# Patient Record
Sex: Male | Born: 1950 | Race: White | Hispanic: No | Marital: Married | Smoking: Former smoker
Health system: Southern US, Community
[De-identification: ages and names within clinical notes are randomized; demographics above are authoritative.]

## PROBLEM LIST (undated history)

## (undated) DIAGNOSIS — C220 Liver cell carcinoma: Secondary | ICD-10-CM

## (undated) DIAGNOSIS — B1920 Unspecified viral hepatitis C without hepatic coma: Secondary | ICD-10-CM

## (undated) DIAGNOSIS — Z923 Personal history of irradiation: Secondary | ICD-10-CM

## (undated) DIAGNOSIS — C7951 Secondary malignant neoplasm of bone: Secondary | ICD-10-CM

## (undated) HISTORY — DX: Personal history of irradiation: Z92.3

---

## 2007-07-13 ENCOUNTER — Ambulatory Visit: Payer: Self-pay | Admitting: Family Medicine

## 2007-09-10 ENCOUNTER — Ambulatory Visit: Payer: Self-pay | Admitting: Family Medicine

## 2009-03-31 HISTORY — PX: LIVER TRANSPLANT: SHX410

## 2009-06-13 IMAGING — CR DG CHEST 2V
1 series · 3 of 3 positions shown · non-contrast
Comparison: none

REASON FOR EXAM: cough
COMMENTS:

[Series 1: view not recorded · 0.17mm/px · 3 of 3 slices shown]
[im 1/3]
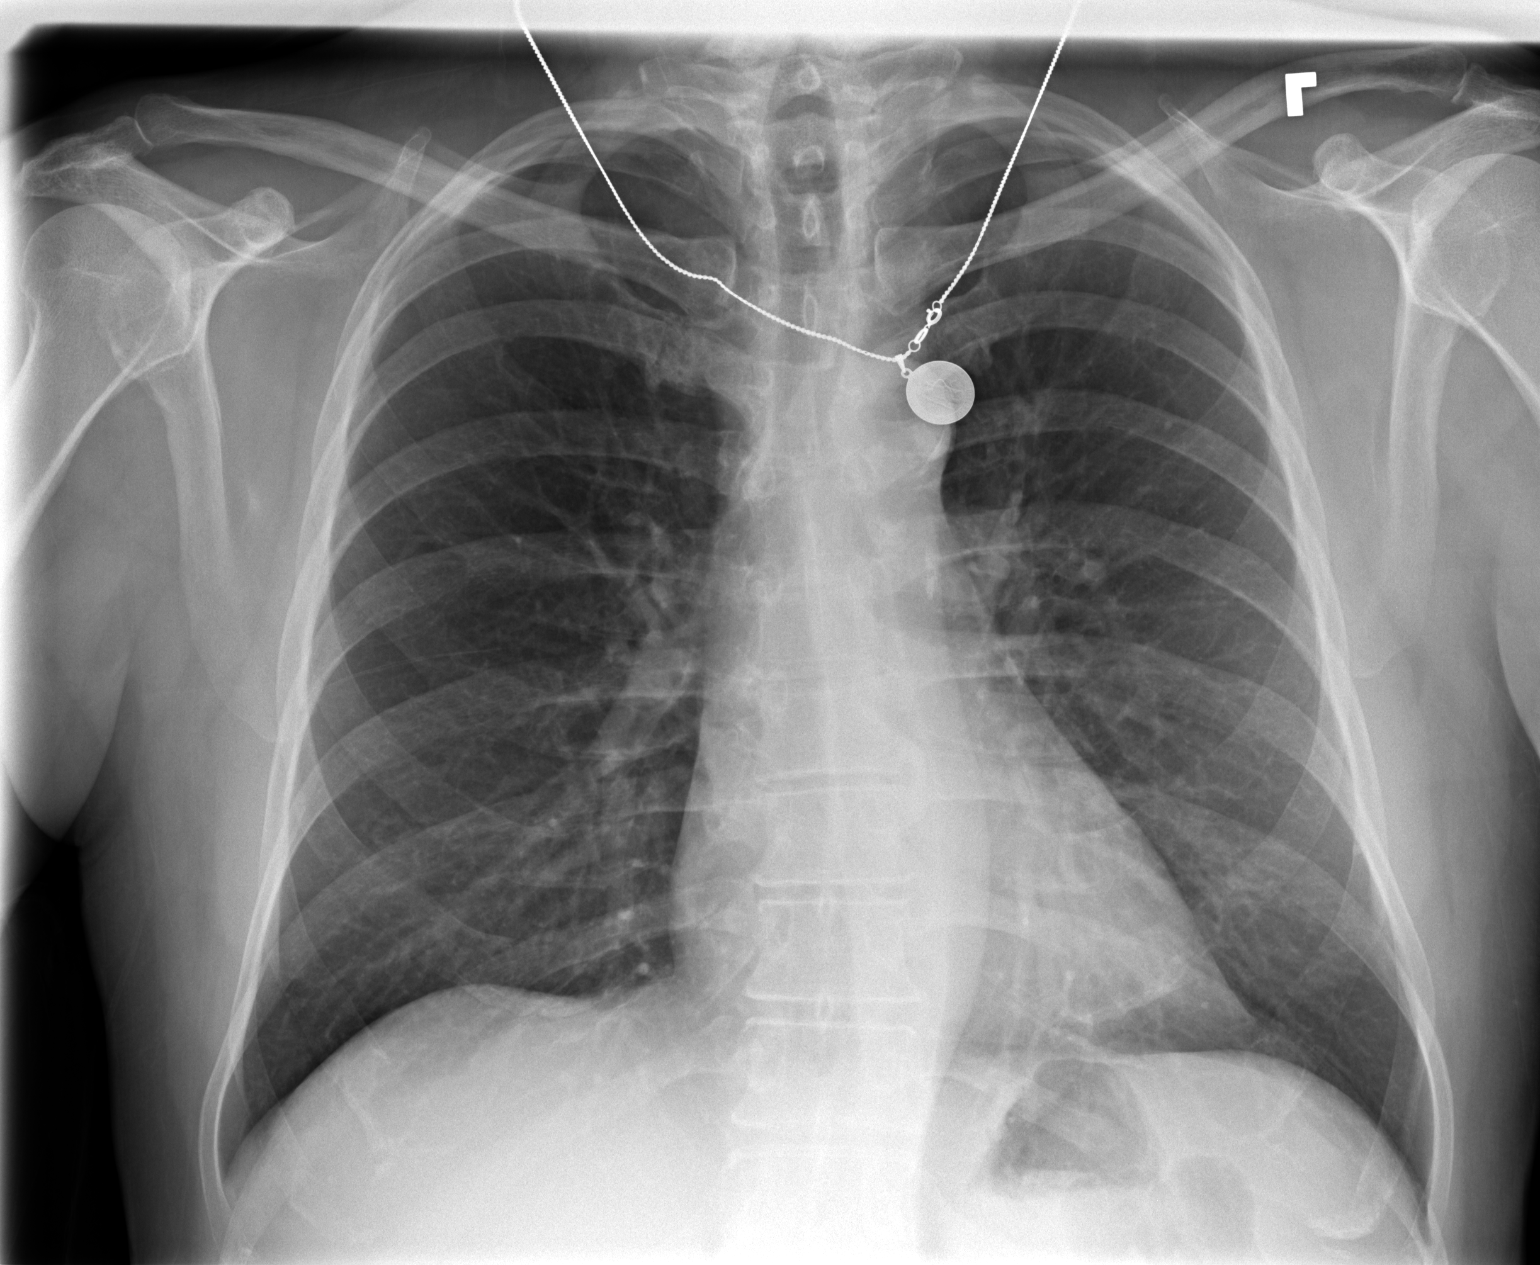
[im 2/3]
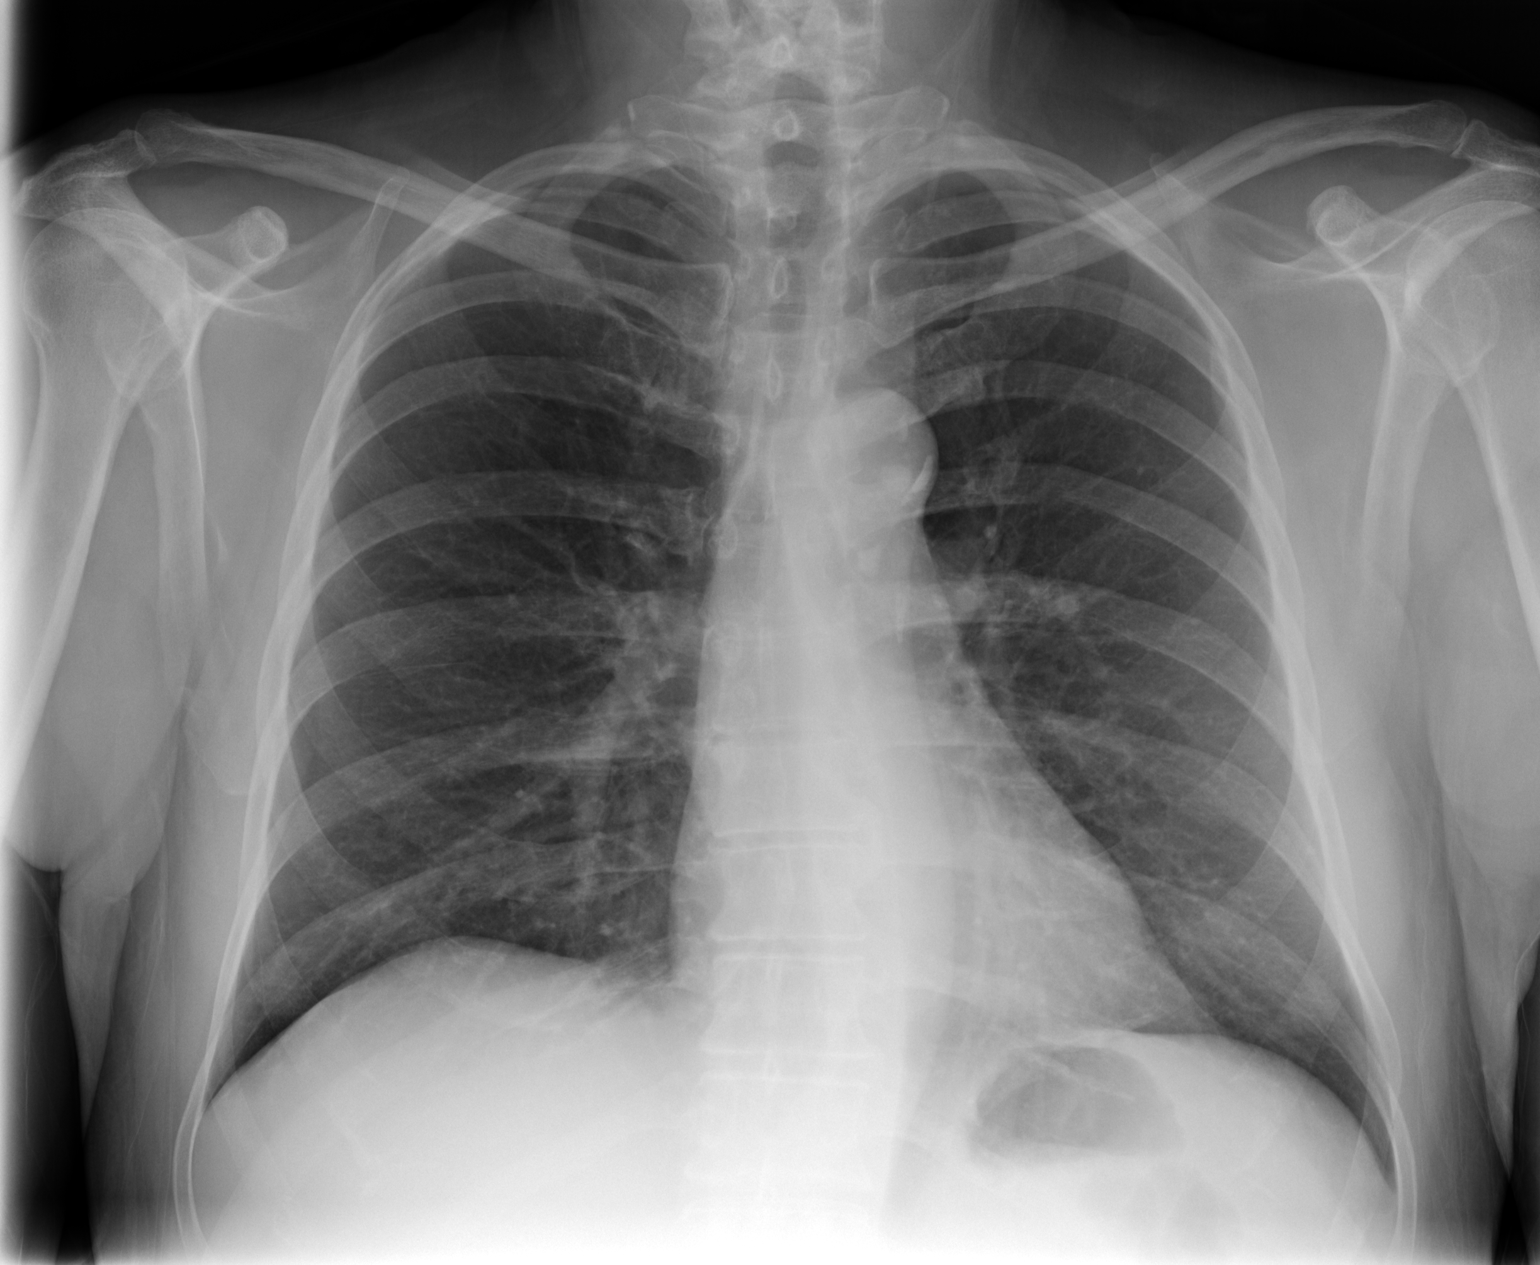
[im 3/3]
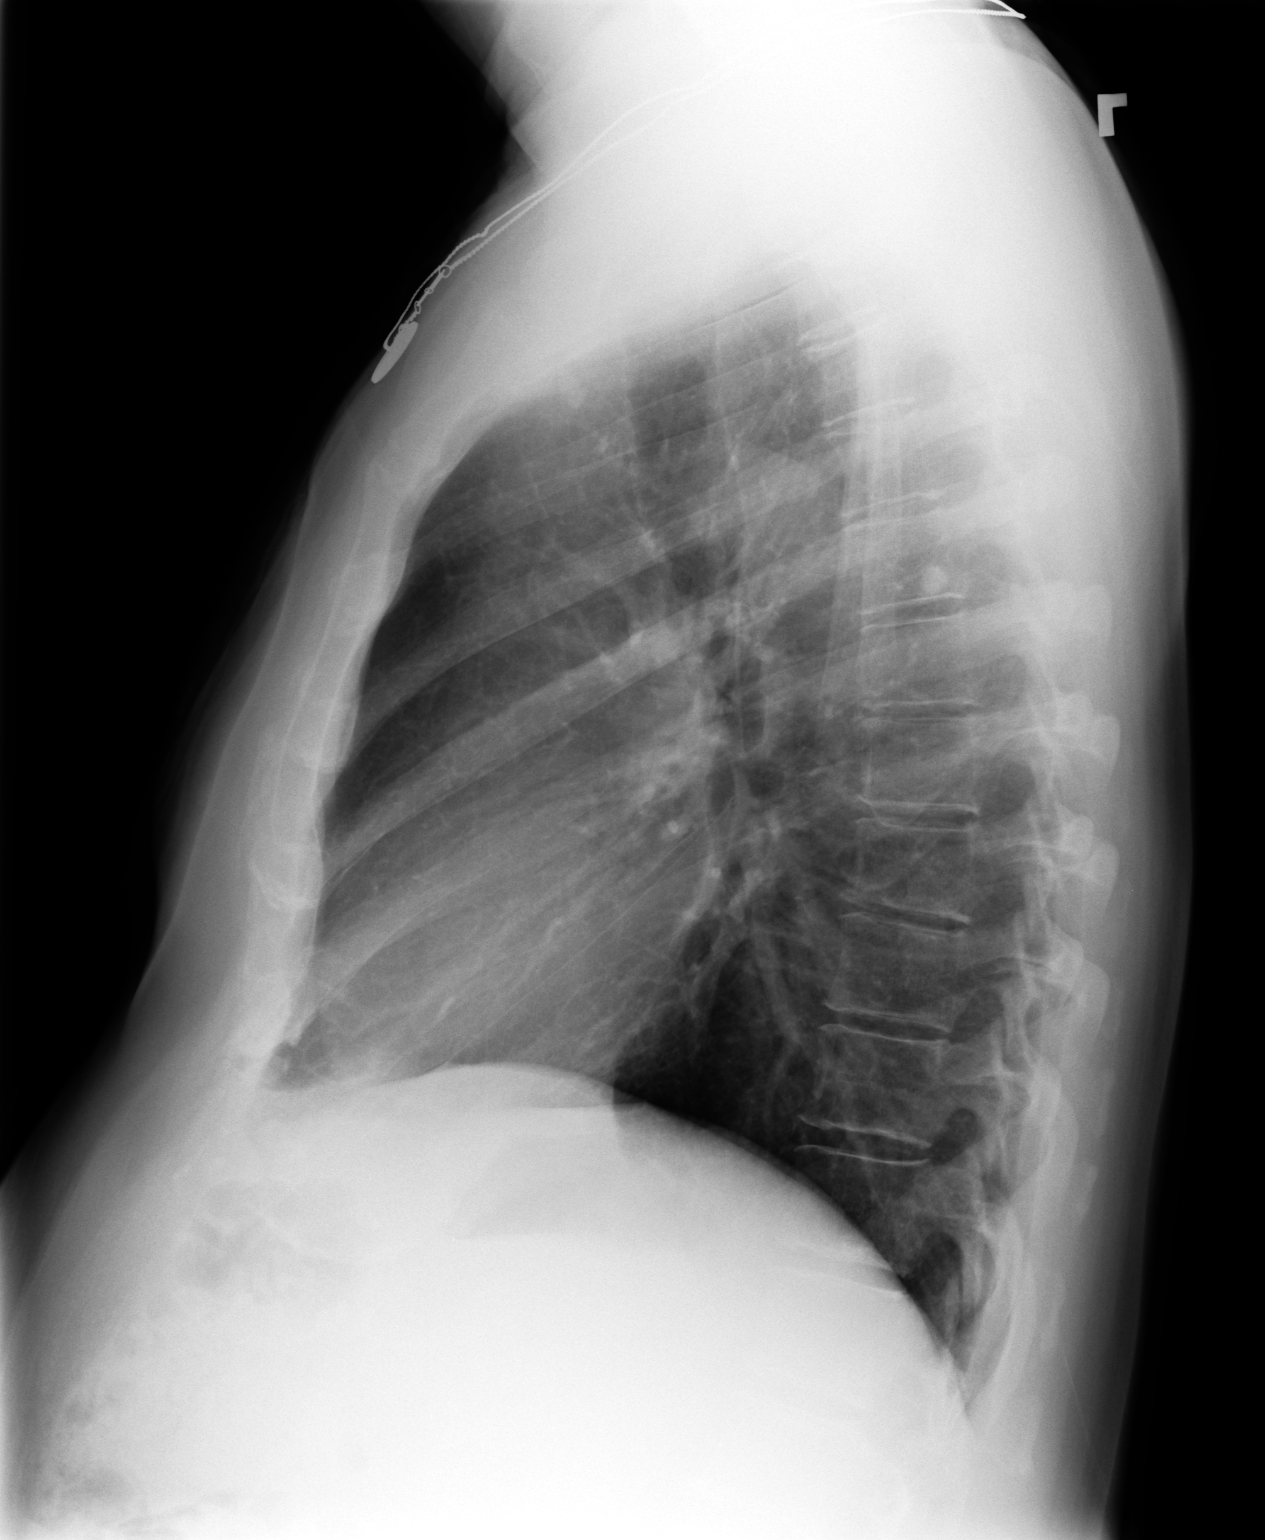

[3 of 3 positions shown; findings below may reference images not displayed]

PROCEDURE:     MDR - MDR CHEST PA(OR AP) AND LATERAL  - July 13, 2007  [DATE]

RESULT:     The lung fields are clear. No pneumonia, pneumothorax or other
acute change is identified. Heart size is normal. There is noted a very
slight thoracolumbar scoliosis with the convexity to the LEFT. There is
minimal atherosclerotic calcification in the aortic knob.
IMPRESSION: 1. The lung fields are clear.
2. There is a slight thoracic scoliosis.

## 2011-04-01 HISTORY — PX: LIVER BIOPSY: SHX301

## 2013-05-18 ENCOUNTER — Encounter: Payer: Self-pay | Admitting: Radiation Oncology

## 2013-05-18 NOTE — Progress Notes (Signed)
Histology and Location of Primary Cancer: Metastatic Hepatocellular Carcinoma  Sites of Visceral and Bony Metastatic Disease: foot, left mandibular, T1 extending into left epidural space and adjacent left sided C7-T1 and T1-2 neural foramina  Location(s) of Symptomatic Metastases: left shoulder/upper arm  Past/Anticipated chemotherapy by medical oncology, if any: 3-06/2012 sorafenib   Pain on a scale of 0-10 is: 4 in left upper arm   If Spine Met(s), symptoms, if any, include:  Bowel/Bladder retention or incontinence (please describe): no  Numbness or weakness in extremities (please describe): numbness in fingers of left hand  Current Decadron regimen, if applicable: decadron 4 mg BID  Ambulatory status? Walker? Wheelchair?: ambulatory  SAFETY ISSUES:  Prior radiation? Yes - 04/23/2012 right sided yttrium label microspheres, radioembolization to his progressive right hepatic lobe, 11/01/2012-11/12/12 palliative RT to left foot met and left mandibular met 3000 cGy, 11/04/12-11/16/12 left nasal vestibule 2700 cGy  Pacemaker/ICD? no  Possible current pregnancy? no  Is the patient on methotrexate? no  Current Complaints / other details:  Patient here with his wife.  He has three children.  He has noticed confusion for the past week.  Had trouble swallowing two weeks ago.  Used thick-it for liquids.  He reports that he is not having trouble swallowing now.

## 2013-05-19 ENCOUNTER — Ambulatory Visit
Admission: RE | Admit: 2013-05-19 | Discharge: 2013-05-19 | Disposition: A | Discharge status: Home / Self Care | Payer: BC Managed Care – PPO | Source: Ambulatory Visit | Attending: Radiation Oncology | Admitting: Radiation Oncology

## 2013-05-19 ENCOUNTER — Encounter: Payer: Self-pay | Admitting: Radiation Oncology

## 2013-05-19 VITALS — BP 132/64 | HR 85 | Temp 99.2°F | Ht 69.0 in | Wt 132.7 lb

## 2013-05-19 DIAGNOSIS — C220 Liver cell carcinoma: Secondary | ICD-10-CM

## 2013-05-19 DIAGNOSIS — R209 Unspecified disturbances of skin sensation: Secondary | ICD-10-CM | POA: Insufficient documentation

## 2013-05-19 DIAGNOSIS — Z87891 Personal history of nicotine dependence: Secondary | ICD-10-CM | POA: Insufficient documentation

## 2013-05-19 DIAGNOSIS — C7951 Secondary malignant neoplasm of bone: Secondary | ICD-10-CM | POA: Insufficient documentation

## 2013-05-19 DIAGNOSIS — Z944 Liver transplant status: Secondary | ICD-10-CM | POA: Insufficient documentation

## 2013-05-19 DIAGNOSIS — M25519 Pain in unspecified shoulder: Secondary | ICD-10-CM | POA: Insufficient documentation

## 2013-05-19 DIAGNOSIS — C228 Malignant neoplasm of liver, primary, unspecified as to type: Secondary | ICD-10-CM | POA: Insufficient documentation

## 2013-05-19 DIAGNOSIS — C7952 Secondary malignant neoplasm of bone marrow: Secondary | ICD-10-CM

## 2013-05-19 DIAGNOSIS — Z79899 Other long term (current) drug therapy: Secondary | ICD-10-CM | POA: Insufficient documentation

## 2013-05-19 HISTORY — DX: Secondary malignant neoplasm of bone: C79.51

## 2013-05-19 HISTORY — DX: Liver cell carcinoma: C22.0

## 2013-05-19 MED ORDER — FLUCONAZOLE 100 MG PO TABS: 100.0000 mg | ORAL_TABLET | Freq: Every day | ORAL | Status: DC

## 2013-05-19 NOTE — Progress Notes (Signed)
Please see the Nurse Progress Note in the MD Initial Consult Encounter for this patient. 

## 2013-05-19 NOTE — Progress Notes (Signed)
Radiation Oncology         [phone] ________________________________  Initial outpatient Consultation  Name: Colin Burke MRN: [id number]  Date: 05/19/2013  DOB: [birth year]  CC:No primary provider on file.  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 male who is seen out of the courtesy of Dr. Altamease Oiler for an opinion concerning radiation therapy as part management of patient's metastatic hepatocellular carcinoma.  Patient  most recentlyy developed pain in the left shoulder area as well as some numbness to the fourth and fifth digits of his left hand.  Patient was seen by medical oncology and scanning was performed which is documented below. Patient is been referred to radiation oncology for consideration for additional palliative radiation therapy.  The patient has recently moved close to the [place] area and wishes to have his palliative treatment here at our facility.  PREVIOUS RADIATION THERAPY:  Yes - 04/23/2012 right sided yttrium label microspheres, radioembolization to his progressive right hepatic lobe, 11/01/2012-11/12/12 palliative RT to left foot met and left mandibular met 3000 cGy, 11/04/12-11/16/12 left nasal vestibule 2700 cGy.  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    PAST SURGICAL HISTORY: Past Surgical History  Procedure Laterality Date  . Liver transplant  2011  . Liver biopsy  2013    FAMILY HISTORY: family history is not on file.  SOCIAL HISTORY:  reports that he quit smoking about 4 years ago. His smoking use included Cigarettes. He has a 60 pack-year smoking history. He does not have any smokeless tobacco history on file. He reports that he does not drink alcohol or use illicit drugs.  ALLERGIES:  Review of patient's allergies indicates no known allergies.  MEDICATIONS:  Current Outpatient Prescriptions  Medication Sig Dispense Refill  . bisacodyl (DULCOLAX) 5 MG EC tablet Take 5 mg by mouth daily as needed for moderate constipation.      Marland Kitchen buPROPion (BUDEPRION XL) 300 MG 24 hr tablet Take 300 mg by mouth daily.      Marland Kitchen dexamethasone (DECADRON) 4 MG tablet Take 4 mg by mouth 2 (two) times daily.      Marland Kitchen docusate sodium (COLACE) 100 MG capsule Take 100 mg by mouth 2 (two) times daily.      Marland Kitchen gabapentin (NEURONTIN) 300 MG capsule Take 300 mg by mouth 3 (three) times daily.       . haloperidol (HALDOL) 0.5 MG tablet Take 0.5 mg by mouth every 4 (four) hours as needed for agitation.      Marland Kitchen ibuprofen (ADVIL,MOTRIN) 200 MG tablet Take 200 mg by mouth daily as needed.      Marland Kitchen LORazepam (ATIVAN) 1 MG tablet Take 1 mg by mouth every 2 (two) hours as needed for anxiety.      . magnesium oxide (MAG-OX) 400 MG tablet Take 400 mg by mouth. Takes 2 capsules twice a day      . methadone (DOLOPHINE) 10 MG tablet Take 20 mg by mouth every 8 (eight) hours.      Marland Kitchen omeprazole (PRILOSEC) 40 MG capsule Take 40 mg by mouth daily.      . ondansetron (ZOFRAN) 8 MG tablet Take 8 mg by mouth.      . Oxycodone HCl 10 MG TABS Take two tablets (  20 mg) by mouth every 1 hour as needed for breakthrough pain/sob.  Hospice patient      . oxyCODONE-acetaminophen (ROXICET) 5-325 MG/5ML solution Take by mouth.      . sennosides-docusate sodium (SENOKOT-S) 8.6-50 MG tablet Take 2 tablets by mouth 2 (two) times daily.      Marland Kitchen STARCH-MALTO DEXTRIN (THICK-IT) POWD Take 1 scoop by mouth as needed.      . tacrolimus (PROGRAF) 1 MG capsule Take 2 mg by mouth 2 (two) times daily.        No current facility-administered medications for this encounter.    REVIEW OF SYSTEMS:  A 15 point review of systems is documented in the electronic medical record. This was obtained by the nursing staff. However, I reviewed this with the patient to  discuss relevant findings and make appropriate changes. He complains of pain in the left shoulder and some numbness along his fourth and fifth digit He has  also noticed possibly some slight left-handed weakness   PHYSICAL EXAM:  height is 5' 9"  (1.753 m) and weight is 132 lb 11.2 oz (60.192 kg). His temperature is 99.2 F (37.3 C). His blood pressure is 132/64 and his pulse is 85. His oxygen saturation is 98%.   BP 132/64  Pulse 85  Temp(Src) 99.2 F (37.3 C)  Ht 5' 9"  (1.753 m)  Wt 132 lb 11.2 oz (60.192 kg)  BMI 19.59 kg/m2  SpO2 98%  General Appearance:     cooperative, no distress, appears stated age, quite lethargic on exam today and oftentimes will dose off, he was able to walk into our clinic with the assistance of his wife   Head:    Normocephalic, without obvious abnormality, atraumatic  Eyes:    PERRL, conjunctiva/corneas clear, EOM's intact,          Nose:   Nares normal, septum midline, mucosa normal, no drainage    or sinus tenderness  Throat:   Lips, mucosa, normal, Candida infection in the oral cavity   Neck:   Supple, symmetrical, trachea midline, no adenopathy;       thyroid:  No enlargement/tenderness/nodules; no carotid   bruit or JVD  Back:     Symmetric, no curvature, ROM normal, no CVA tenderness  Lungs:     Clear to auscultation bilaterally, respirations unlabored  Chest wall:    No tenderness or deformity  Heart:    Regular rate and rhythm, S1 and S2 normal, no murmur, rub   or gallop  Abdomen:     Soft, non-tender, bowel sounds active all four quadrants,            Extremities:   Extremities normal, atraumatic, no cyanosis or edema  Pulses:   2+ and symmetric all extremities  Skin:   Skin color, texture, turgor normal, no rashes or lesions  Lymph nodes:   Cervical, supraclavicular, and axillary nodes normal  Neurologic:    decreased grip strength along the left hand area, able to ambulate with the assistance       ECOG = 3  3 - Symptomatic, >50% in  bed, but not bedbound (Capable of only limited self-care, confined to bed or chair 50% or more of waking hours)  LABORATORY DATA:  No results found for this basename: WBC, HGB, HCT, MCV, PLT   No results found for this basename: NA, K, CL, CO2   No results found for this basename: ALT, AST, GGT, ALKPHOS, BILITOT     RADIOGRAPHY: No results found.  IMPRESSION: Metastatic hepatocellular carcinoma. Patient has significant disease in the lower cervical spine and upper thoracic spine region likely compressing or involving the C8 and T1 nerve roots. This is likely causing the patient's pain along the left shoulder and neurologic issues. Patient would be a good candidate for palliative radiation therapy directed at this area.  PLAN: Simulation and planning early next week. Anticipate approximately 10 treatments directed at the area of concern.  The patient will be placed on Diflucan for his oral candidiasis infection. I spent 40 minutes minutes face to face with the patient and more than 50% of that time was spent in counseling and/or coordination of care.   ------------------------------------------------

## 2013-05-20 ENCOUNTER — Ambulatory Visit
Admission: RE | Admit: 2013-05-20 | Discharge: 2013-05-20 | Disposition: A | Discharge status: Home / Self Care | Payer: BC Managed Care – PPO | Source: Ambulatory Visit | Attending: Radiation Oncology | Admitting: Radiation Oncology

## 2013-05-20 ENCOUNTER — Telehealth: Payer: Self-pay | Admitting: Oncology

## 2013-05-20 DIAGNOSIS — R5381 Other malaise: Secondary | ICD-10-CM | POA: Insufficient documentation

## 2013-05-20 DIAGNOSIS — R5383 Other fatigue: Secondary | ICD-10-CM

## 2013-05-20 DIAGNOSIS — C228 Malignant neoplasm of liver, primary, unspecified as to type: Secondary | ICD-10-CM | POA: Insufficient documentation

## 2013-05-20 DIAGNOSIS — M25519 Pain in unspecified shoulder: Secondary | ICD-10-CM | POA: Insufficient documentation

## 2013-05-20 DIAGNOSIS — C7952 Secondary malignant neoplasm of bone marrow: Secondary | ICD-10-CM

## 2013-05-20 DIAGNOSIS — C7951 Secondary malignant neoplasm of bone: Secondary | ICD-10-CM

## 2013-05-20 DIAGNOSIS — Z510 Encounter for antineoplastic radiation therapy: Secondary | ICD-10-CM | POA: Insufficient documentation

## 2013-05-20 NOTE — Addendum Note (Signed)
Encounter addended by: Jacqulyn Liner, RN on: 05/20/2013  2:15 PM<BR>     Documentation filed: Demographics Visit

## 2013-05-20 NOTE — Progress Notes (Signed)
Name: Colin Burke   MRN: [id number]  Date:  05/20/2013  DOB: April 05, 1950  Status:outpatient    DIAGNOSIS: Metastatic hepatocellular carcinoma  CONSENT VERIFIED: yes   SET UP: Patient is setup supine   IMMOBILIZATION:  The following immobilization was used: qfix mask  NARRATIVE:  [name] was brought to the [facility] Energy manager.  Identity was confirmed.  All relevant records and images related to the planned course of therapy were reviewed.  Then, the patient was positioned in a stable reproducible clinical set-up for radiation therapy.  CT images were obtained.  An isocenter was placed. Skin markings were placed.  The CT images were loaded into the planning software where the target and avoidance structures were contoured.  The radiation prescription was entered and confirmed. The patient was discharged in stable condition and tolerated simulation well.    TREATMENT PLANNING NOTE:  Treatment planning then occurred. I have requested : MLC's, isodose plan, basic dose calculation  I have requested 3 dimensional simulation with DVH of cord, lungs, and gtv.

## 2013-05-20 NOTE — Telephone Encounter (Signed)
Called Dr. Randell Burke office at [facility] and talked to her nurse Colin Burke regarding whether diflucan is OK for Colin Burke to take for thrush.  Per Colin Burke and the [facility] pharmacist, diflucan can increase the concentration of tacrolimus (Prograf) that Colin Burke is taking.  Colin Burke advised calling his transplant team for a final say.  [facility] transplant was called and a message was left for a call back.  Per the [facility] [place] pharmacist, nystatin swish and swallow does not interfere with Prograf.  Called in to [facility] [address] - nystatin 100,000 units/ml - swish and swallow 5 ml four times a day for 7 days.  Dispense 140 mL. 0 refills per Dr. Sondra Burke.  Called Colin Burke's wife, Colin Burke, to let her know the prescription had been called in.

## 2013-05-20 NOTE — Addendum Note (Signed)
Encounter addended by: Jacqulyn Liner, RN on: 05/20/2013  1:53 PM<BR>     Documentation filed: Demographics Visit

## 2013-05-24 ENCOUNTER — Ambulatory Visit: Payer: BC Managed Care – PPO | Admitting: Radiation Oncology

## 2013-05-24 ENCOUNTER — Ambulatory Visit
Admission: RE | Admit: 2013-05-24 | Payer: BC Managed Care – PPO | Source: Ambulatory Visit | Admitting: Radiation Oncology

## 2013-05-25 ENCOUNTER — Ambulatory Visit
Admission: RE | Admit: 2013-05-25 | Discharge: 2013-05-25 | Disposition: A | Discharge status: Home / Self Care | Payer: BC Managed Care – PPO | Source: Ambulatory Visit | Attending: Radiation Oncology | Admitting: Radiation Oncology

## 2013-05-25 ENCOUNTER — Ambulatory Visit: Payer: BC Managed Care – PPO | Admitting: Radiation Oncology

## 2013-05-25 VITALS — Wt 132.7 lb

## 2013-05-25 DIAGNOSIS — C7951 Secondary malignant neoplasm of bone: Secondary | ICD-10-CM

## 2013-05-25 DIAGNOSIS — C7952 Secondary malignant neoplasm of bone marrow: Principal | ICD-10-CM

## 2013-05-25 MED ORDER — BIAFINE EX EMUL: CUTANEOUS | Status: DC | PRN

## 2013-05-25 NOTE — Progress Notes (Signed)
Patient education done, rad book, biafine cream given, discussed, skin irritation, fatigue,pain,  Mouth and throat changes, increas protein in diet,high calories, keep mouth moist,pain, biotene rinses, if nausea,  Feel like food gets stuck , notify MD Teach back by patient, alrady drinks ensure daily,monitor weight,sees MD weekly/prn, can call for any questions concerns, gave Elmo Putt RN business card

## 2013-05-25 NOTE — Progress Notes (Signed)
  Radiation Oncology         [phone] ________________________________  Name: Colin Burke MRN: [id number]  Date: 05/25/2013  DOB: 1950-12-04  Simulation Verification Note  Status: outpatient  NARRATIVE: The patient was brought to the treatment unit and placed in the planned treatment position. The clinical setup was verified. Then port films were obtained and uploaded to the radiation oncology medical record software.  The treatment beams were carefully compared against the planned radiation fields. The position location and shape of the radiation fields was reviewed. They targeted volume of tissue appears to be appropriately covered by the radiation beams. Organs at risk appear to be excluded as planned.  Based on my personal review, I approved the simulation verification. The patient's treatment will proceed as planned.  -----------------------------------  Blair Promise, PhD, MD

## 2013-05-26 ENCOUNTER — Ambulatory Visit: Payer: BC Managed Care – PPO

## 2013-05-27 ENCOUNTER — Ambulatory Visit
Admission: RE | Admit: 2013-05-27 | Discharge: 2013-05-27 | Disposition: A | Discharge status: Home / Self Care | Payer: BC Managed Care – PPO | Source: Ambulatory Visit | Attending: Radiation Oncology | Admitting: Radiation Oncology

## 2013-05-27 NOTE — Progress Notes (Signed)
[place] Psychosocial Distress Screening Clinical Social Work  Clinical Social Work was referred by distress screening protocol.  The patient scored a 10 on the Psychosocial Distress Thermometer which indicates severe distress. Clinical Social Worker Intern telephoned to assess for distress and other psychosocial needs. Patient indicated he has a high level of pain controlled by morphine temporarily.  CSWI asked whether Patient had discussed pain issues with Dr.  Patient and wife (both on phone) indicated they had discussed with Dr and Hospice nurse and new other options if needed.  Patient also requested assistance with [employer].  Patient was provided with Nutritionist contact information.  Patient was informed of services and agrees to contact [place] if further assistance was needed.   Clinical Social Worker follow up needed: no  If yes, follow up plan:   Dominika Losey S. Carlock Work Intern [employer] [phone]

## 2013-05-30 ENCOUNTER — Ambulatory Visit
Admission: RE | Admit: 2013-05-30 | Discharge: 2013-05-30 | Disposition: A | Discharge status: Home / Self Care | Payer: BC Managed Care – PPO | Source: Ambulatory Visit | Attending: Radiation Oncology | Admitting: Radiation Oncology

## 2013-05-31 ENCOUNTER — Encounter: Payer: Self-pay | Admitting: Radiation Oncology

## 2013-05-31 ENCOUNTER — Ambulatory Visit
Admission: RE | Admit: 2013-05-31 | Discharge: 2013-05-31 | Disposition: A | Discharge status: Home / Self Care | Payer: BC Managed Care – PPO | Source: Ambulatory Visit | Attending: Radiation Oncology | Admitting: Radiation Oncology

## 2013-05-31 VITALS — BP 134/74 | HR 69 | Temp 97.9°F | Resp 20 | Wt 131.4 lb

## 2013-05-31 DIAGNOSIS — C7951 Secondary malignant neoplasm of bone: Secondary | ICD-10-CM

## 2013-05-31 DIAGNOSIS — C7952 Secondary malignant neoplasm of bone marrow: Principal | ICD-10-CM

## 2013-05-31 NOTE — Progress Notes (Signed)
Weekly rad tx C_T spine 4/10 completed, voiced no c/o pain, nausea, sob, "I'm good" no fatigue 10:05 AM

## 2013-05-31 NOTE — Progress Notes (Signed)
  Radiation Oncology         [phone] ________________________________  Name: BRAUN ROCCA MRN: [id number]  Date: 05/31/2013  DOB: 01-16-1951  Weekly Radiation Therapy Management  Current Dose: 12 Gy     Planned Dose:  30 Gy  Narrative . . . . . . . . The patient presents for routine under treatment assessment.                                   The patient is without complaint. His pain in the left shoulder is already improved. He denies any swallowing difficulties or pain with swallowing                                 Set-up films were reviewed.                                 The chart was checked. Physical Findings. . .  weight is 131 lb 6.4 oz (59.603 kg). His oral temperature is 97.9 F (36.6 C). His blood pressure is 134/74 and his pulse is 69. His respiration is 20 and oxygen saturation is 100%. . Weight essentially stable.  The patient's oral thrush has cleared up Impression . . . . . . . The patient is tolerating radiation. Plan . . . . . . . . . . . . Continue treatment as planned.  ________________________________  Rulon Sera.D.

## 2013-06-01 ENCOUNTER — Ambulatory Visit
Admission: RE | Admit: 2013-06-01 | Discharge: 2013-06-01 | Disposition: A | Discharge status: Home / Self Care | Payer: BC Managed Care – PPO | Source: Ambulatory Visit | Attending: Radiation Oncology | Admitting: Radiation Oncology

## 2013-06-02 ENCOUNTER — Ambulatory Visit
Admission: RE | Admit: 2013-06-02 | Discharge: 2013-06-02 | Disposition: A | Discharge status: Home / Self Care | Payer: BC Managed Care – PPO | Source: Ambulatory Visit | Attending: Radiation Oncology | Admitting: Radiation Oncology

## 2013-06-03 ENCOUNTER — Ambulatory Visit
Admission: RE | Admit: 2013-06-03 | Discharge: 2013-06-03 | Disposition: A | Discharge status: Home / Self Care | Payer: BC Managed Care – PPO | Source: Ambulatory Visit | Attending: Radiation Oncology | Admitting: Radiation Oncology

## 2013-06-06 ENCOUNTER — Ambulatory Visit: Payer: BC Managed Care – PPO

## 2013-06-06 ENCOUNTER — Ambulatory Visit
Admission: RE | Admit: 2013-06-06 | Discharge: 2013-06-06 | Disposition: A | Discharge status: Home / Self Care | Payer: BC Managed Care – PPO | Source: Ambulatory Visit | Attending: Radiation Oncology | Admitting: Radiation Oncology

## 2013-06-07 ENCOUNTER — Encounter: Payer: Self-pay | Admitting: Radiation Oncology

## 2013-06-07 ENCOUNTER — Ambulatory Visit
Admission: RE | Admit: 2013-06-07 | Discharge: 2013-06-07 | Disposition: A | Discharge status: Home / Self Care | Payer: BC Managed Care – PPO | Source: Ambulatory Visit | Attending: Radiation Oncology | Admitting: Radiation Oncology

## 2013-06-07 ENCOUNTER — Ambulatory Visit: Payer: BC Managed Care – PPO

## 2013-06-07 VITALS — BP 111/70 | HR 77 | Temp 97.5°F | Ht 69.0 in | Wt 129.2 lb

## 2013-06-07 DIAGNOSIS — C7951 Secondary malignant neoplasm of bone: Secondary | ICD-10-CM

## 2013-06-07 DIAGNOSIS — C7952 Secondary malignant neoplasm of bone marrow: Principal | ICD-10-CM

## 2013-06-07 NOTE — Progress Notes (Signed)
Colin Burke has received 9/10 fractions to the C-T spine.  He denies any pain presently, but admits to fatigue.  He is maintaining good pain control with Morphine and Methadone.

## 2013-06-07 NOTE — Progress Notes (Signed)
  Radiation Oncology         [phone] ________________________________  Name: Colin Burke MRN: [id number]  Date: 06/07/2013  DOB: July 03, 1950  Weekly Radiation Therapy Management  Current Dose: 27 Gy     Planned Dose:  30 Gy  Narrative . . . . . . . . The patient presents for routine under treatment assessment.                                   The patient is without complaint. His shoulder pain is much better. He denies any difficulties with swallowing foods. He had chicken and mashed potatoes last night as well as a salad                                 Set-up films were reviewed.                                 The chart was checked. Physical Findings. . .  height is 5\' 9"  (1.753 m) and weight is 129 lb 3.2 oz (58.605 kg). His temperature is 97.5 F (36.4 C). His blood pressure is 111/70 and his pulse is 77. . Weight essentially stable.  No significant changes. Mild erythema and hyperpigmentation changes in the lower neck and upper chest region. No signs of yeast infection in the mouth this time. Impression . . . . . . . The patient is tolerating radiation. Plan . . . . . . . . . . . . Continue treatment as planned.  ________________________________  -----------------------------------  Blair Promise, PhD, MD

## 2013-06-08 ENCOUNTER — Telehealth: Payer: Self-pay | Admitting: Oncology

## 2013-06-08 ENCOUNTER — Ambulatory Visit
Admission: RE | Admit: 2013-06-08 | Discharge: 2013-06-08 | Disposition: A | Discharge status: Home / Self Care | Payer: BC Managed Care – PPO | Source: Ambulatory Visit | Attending: Radiation Oncology | Admitting: Radiation Oncology

## 2013-06-08 NOTE — Telephone Encounter (Signed)
Called in prescription per Dr. Sondra Come to [facility] for sucralfate (CARAFATE) 1 GM/10ML suspension 06/08/2013 Sig - Route: Take 1 g by mouth 4 (four) times daily - 1 hour before meals and at bedtime.  Disp. 420 ml with 1 refill.  Called Taryn with Hospice to let her know.  Also spoke to [facility], Mr. Bley wife, to let her know about the medication and that [facility]  Advised her to call if he does not have improvement with his sore throat pain.  Mary verbalized agreement.

## 2013-06-08 NOTE — Telephone Encounter (Signed)
Taryn from Hospice called and said that Menno called her about a sore throat and said it hurts when he swallows.  Advised her that Dr. Sondra Come will be notified and we will call her back this afternoon.

## 2013-06-20 ENCOUNTER — Encounter: Payer: Self-pay | Admitting: Radiation Oncology

## 2013-06-20 ENCOUNTER — Emergency Department (HOSPITAL_COMMUNITY)
Admission: EM | Admit: 2013-06-20 | Discharge: 2013-06-21 | Disposition: A | Discharge status: Home / Self Care | Payer: BC Managed Care – PPO | Attending: Emergency Medicine | Admitting: Emergency Medicine

## 2013-06-20 ENCOUNTER — Encounter (HOSPITAL_COMMUNITY): Payer: Self-pay | Admitting: Emergency Medicine

## 2013-06-20 DIAGNOSIS — R52 Pain, unspecified: Secondary | ICD-10-CM

## 2013-06-20 DIAGNOSIS — Z87891 Personal history of nicotine dependence: Secondary | ICD-10-CM | POA: Insufficient documentation

## 2013-06-20 DIAGNOSIS — Z8505 Personal history of malignant neoplasm of liver: Secondary | ICD-10-CM | POA: Insufficient documentation

## 2013-06-20 DIAGNOSIS — Z79899 Other long term (current) drug therapy: Secondary | ICD-10-CM | POA: Insufficient documentation

## 2013-06-20 DIAGNOSIS — C7952 Secondary malignant neoplasm of bone marrow: Secondary | ICD-10-CM

## 2013-06-20 DIAGNOSIS — R109 Unspecified abdominal pain: Secondary | ICD-10-CM | POA: Insufficient documentation

## 2013-06-20 DIAGNOSIS — C7951 Secondary malignant neoplasm of bone: Secondary | ICD-10-CM | POA: Insufficient documentation

## 2013-06-20 DIAGNOSIS — C799 Secondary malignant neoplasm of unspecified site: Secondary | ICD-10-CM

## 2013-06-20 MED ORDER — HYDROMORPHONE HCL PF 2 MG/ML IJ SOLN
2.0000 mg | INTRAMUSCULAR | Status: DC | PRN
Start: 2013-06-20 — End: 2013-06-21
  Administered 2013-06-20 (×2): 2 mg via INTRAVENOUS
  Filled 2013-06-20 (×2): qty 1

## 2013-06-20 MED ORDER — ONDANSETRON HCL 4 MG/2ML IJ SOLN
4.0000 mg | Freq: Once | INTRAMUSCULAR | Status: AC | PRN
  Administered 2013-06-20: 4 mg via INTRAVENOUS
  Filled 2013-06-20: qty 2

## 2013-06-20 MED ORDER — HYDROMORPHONE HCL PF 2 MG/ML IJ SOLN
2.0000 mg | INTRAMUSCULAR | Status: AC | PRN
  Administered 2013-06-20 (×3): 2 mg via INTRAVENOUS
  Filled 2013-06-20 (×3): qty 1

## 2013-06-20 MED ORDER — HYDROMORPHONE HCL PF 1 MG/ML IJ SOLN
1.0000 mg | Freq: Once | INTRAMUSCULAR | Status: AC
  Administered 2013-06-20: 1 mg via INTRAVENOUS
  Filled 2013-06-20: qty 1

## 2013-06-20 MED ORDER — HYDROMORPHONE HCL PF 1 MG/ML IJ SOLN
1.0000 mg | INTRAMUSCULAR | Status: DC | PRN
  Administered 2013-06-20: 1 mg via INTRAVENOUS
  Filled 2013-06-20 (×3): qty 1

## 2013-06-20 MED ORDER — FENTANYL 50 MCG/HR TD PT72
50.0000 ug | MEDICATED_PATCH | TRANSDERMAL | Status: DC
  Administered 2013-06-21: 50 ug via TRANSDERMAL

## 2013-06-20 NOTE — Progress Notes (Signed)
Peripherally Inserted Central Catheter/Midline Placement  The IV Nurse has discussed with the patient and/or persons authorized to consent for the patient, the purpose of this procedure and the potential benefits and risks involved with this procedure.  The benefits include less needle sticks, lab draws from the catheter and patient may be discharged home with the catheter.  Risks include, but not limited to, infection, bleeding, blood clot (thrombus formation), and puncture of an artery; nerve damage and irregular heat beat.  Alternatives to this procedure were also discussed.  R cephalic with venospasm on cannulation. Patient feels pain at vein on R basilic. Successful cannulation and placement on L cephalic.    PICC/Midline Placement Documentation  PICC / Midline Single Lumen 85/02/77 PICC Left Cephalic 48 cm 2.5 cm (Active)       Colin Burke 06/20/2013, 11:45 PM

## 2013-06-20 NOTE — ED Notes (Signed)
1 mg Dilaudid given. Unable to scan medication due to order being modified.

## 2013-06-20 NOTE — ED Provider Notes (Signed)
CSN: [id number]     Arrival date & time 06/20/13  1259 History   First MD Initiated Contact with Patient 06/20/13 1319     Chief Complaint  Patient presents with  . Abdominal Pain      HPI  Patient presents with a request for a pick line to be placed. Patient has a history of metastatic cancer including liver metastases. Sees an oncologist at [facility]. He is seeing hospice locally. [facility] hospice physician today for the first time, a Dr. Lyman Speller.  Has been getting subcutaneous infusions as well as by mouth medications with poor control. Hospice recommends PICC line so they can adequately control his pain at his home.  Past Medical History  Diagnosis Date  . 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 Liver transplant  2011  . Liver biopsy  2013   No family history on file. History  Substance Use Topics  . Smoking status: Former Smoker -- 1.50 packs/day for 40 years    Types: Cigarettes    Quit date: 03/31/2009  . Smokeless tobacco: Not on file  . Alcohol Use: No    Review of Systems  Constitutional: Negative for fever, chills, diaphoresis, appetite change and fatigue.  HENT: Negative for mouth sores, sore throat and trouble swallowing.   Eyes: Negative for visual disturbance.  Respiratory: Negative for cough, chest tightness, shortness of breath and wheezing.   Cardiovascular: Negative for chest pain.  Gastrointestinal: Negative for nausea, vomiting, abdominal pain, diarrhea and abdominal distention.       His abdominal pain. Is not vomiting. He is a poor appetite however he feels like he is hydrating well.  Endocrine: Negative for polydipsia, polyphagia and polyuria.  Genitourinary: Negative for dysuria, frequency and hematuria.  Musculoskeletal: Negative for gait problem.  Skin: Negative for color change, pallor and rash.  Neurological: Negative for dizziness, syncope, light-headedness and headaches.  Hematological: Does  not bruise/bleed easily.  Psychiatric/Behavioral: Negative for behavioral problems and confusion.      Allergies  Review of patient's allergies indicates no known allergies.  Home Medications   Current Outpatient Rx  Name  Route  Sig  Dispense  Refill  . buPROPion (BUDEPRION XL) 300 MG 24 hr tablet   Oral   Take 300 mg by mouth daily.         Marland Kitchen dexamethasone (DECADRON) 4 MG tablet   Oral   Take 4 mg by mouth 2 (two) times daily.         Marland Kitchen docusate sodium (COLACE) 100 MG capsule   Oral   Take 100 mg by mouth 2 (two) times daily.         Marland Kitchen gabapentin (NEURONTIN) 300 MG capsule   Oral   Take 300 mg by mouth 3 (three) times daily.          . haloperidol (HALDOL) 0.5 MG tablet   Oral   Take 0.5 mg by mouth every 4 (four) hours as needed for agitation.         Marland Kitchen ibuprofen (ADVIL,MOTRIN) 200 MG tablet   Oral   Take 200 mg by mouth daily as needed for moderate pain.          Marland Kitchen LORazepam (ATIVAN) 1 MG tablet   Oral   Take 1 mg by mouth every 2 (two) hours as needed for anxiety.         Marland Kitchen LORazepam (ATIVAN) 1 MG tablet   Oral  Take 1 mg by mouth at bedtime.         . magnesium oxide (MAG-OX) 400 MG tablet   Oral   Take 800 mg by mouth daily.          . methadone (DOLOPHINE) 10 MG tablet   Oral   Take 20 mg by mouth every 8 (eight) hours.         Marland Kitchen omeprazole (PRILOSEC) 40 MG capsule   Oral   Take 40 mg by mouth daily.         . sennosides-docusate sodium (SENOKOT-S) 8.6-50 MG tablet   Oral   Take 2 tablets by mouth 2 (two) times daily.         . tacrolimus (PROGRAF) 1 MG capsule   Oral   Take 2 mg by mouth 2 (two) times daily.           BP 105/68  Pulse 76  Temp(Src) 98.3 F (36.8 C) (Oral)  Resp 20  SpO2 98% Physical Exam  Constitutional: He is oriented to person, place, and time. He appears well-developed and well-nourished. No distress.  HENT:  Head: Normocephalic.  Eyes: Conjunctivae are normal. Pupils are equal, round,  and reactive to light. No scleral icterus.  Neck: Normal range of motion. Neck supple. No thyromegaly present.  Cardiovascular: Normal rate and regular rhythm.  Exam reveals no gallop and no friction rub.   No murmur heard. Pulmonary/Chest: Effort normal and breath sounds normal. No respiratory distress. He has no wheezes. He has no rales.  Abdominal: Soft. Bowel sounds are normal. He exhibits no distension. There is no tenderness. There is no rebound.  Soft abdomen  Musculoskeletal: Normal range of motion.  Neurological: He is alert and oriented to person, place, and time.  Skin: Skin is warm and dry. No rash noted.  Psychiatric: He has a normal mood and affect. His behavior is normal.    ED Course  Procedures (including critical care time) Labs Review Labs Reviewed - No data to display Imaging Review No results found.   EKG Interpretation None      MDM   Final diagnoses:  Pain  Metastatic cancer    Awaiting PICC line. I do not feel he needs any diagnostic studies at this time.    Tanna Furry, MD 06/20/13 [phone]

## 2013-06-20 NOTE — ED Notes (Signed)
Pt being sent by hopsice to ED. [place] RN [phone] cell phone) and Dr Jeanell Sparrow. Being sent to ED for picc line placement and to be placed on pain drip.   Pt hx of liver cancer with mets, DNR. Attending doctor Sanoff at [facility] oncologist. Pt has seen Dr Sondra Barges locally, received 10 rounds of radiation to spine for pain control. Dr Jeanell Sparrow with hospice saw pt today.  Pt can walk and drink fluids, but unable to eat x3 days due to pain and nausea. Pain increased right side abdomen.   Pt normally takes methadone and MSIR (immediate release morphine) for pain control. Pt took 7  15mg  MSIR with no relief today.  Previously pt was on a subcutaneous morphine drip with not good pain control. So picc line requested.

## 2013-06-20 NOTE — ED Notes (Signed)
IV team called to report they are enroute and need medical bed for procedure. Pt informed and arrangements to be made.

## 2013-06-20 NOTE — ED Notes (Signed)
Pt history of liver cancer with mets; unable to eat or drink x 3 days due to pain/nausea; referred to ER by Dr Lyman Speller for possible picc placement and pain management

## 2013-06-20 NOTE — ED Notes (Signed)
Notified Colin Burke, [facility], of the PICC line situation and she is checking into it.

## 2013-06-20 NOTE — Progress Notes (Signed)
  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   Patient did experience some esophageal symptoms but his pain in the left shoulder and neck did improve with his treatment.  Plan: The patient has completed radiation treatment. The patient will return to radiation oncology clinic for routine followup in one month. I advised them to call or return sooner if they have any questions or concerns related to their recovery or treatment.  -----------------------------------  Blair Promise, PhD, MD

## 2013-06-20 NOTE — Discharge Instructions (Signed)
Followup with your hospice providers for ongoing care.  Abdominal Pain, Adult Many things can cause abdominal pain. Usually, abdominal pain is not caused by a disease and will improve without treatment. It can often be observed and treated at home. Your health care provider will do a physical exam and possibly order blood tests and X-rays to help determine the seriousness of your pain. However, in many cases, more time must pass before a clear cause of the pain can be found. Before that point, your health care provider may not know if you need more testing or further treatment. HOME CARE INSTRUCTIONS  Monitor your abdominal pain for any changes. The following actions may help to alleviate any discomfort you are experiencing:  Only take over-the-counter or prescription medicines as directed by your health care provider.  Do not take laxatives unless directed to do so by your health care provider.  Try a clear liquid diet (broth, tea, or water) as directed by your health care provider. Slowly move to a bland diet as tolerated. SEEK MEDICAL CARE IF:  You have unexplained abdominal pain.  You have abdominal pain associated with nausea or diarrhea.  You have pain when you urinate or have a bowel movement.  You experience abdominal pain that wakes you in the night.  You have abdominal pain that is worsened or improved by eating food.  You have abdominal pain that is worsened with eating fatty foods. SEEK IMMEDIATE MEDICAL CARE IF:   Your pain does not go away within 2 hours.  You have a fever.  You keep throwing up (vomiting).  Your pain is felt only in portions of the abdomen, such as the right side or the left lower portion of the abdomen.  You pass bloody or black tarry stools. MAKE SURE YOU:  Understand these instructions.   Will watch your condition.   Will get help right away if you are not doing well or get worse.  Document Released: 12/25/2004 Document Revised:  01/05/2013 Document Reviewed: 11/24/2012 [facility] Patient Information 2014 [place].

## 2013-06-20 NOTE — ED Notes (Signed)
IV team called to inform us the pt will not be able to get picc line until later in the day. Informed Dr. Jeneen Rinks and patient of delay. Dr. Jeneen Rinks put in prn order for pain meds until able to get picc line placement.

## 2013-07-13 ENCOUNTER — Encounter: Payer: Self-pay | Admitting: Oncology

## 2013-07-14 ENCOUNTER — Ambulatory Visit
Admission: RE | Admit: 2013-07-14 | Discharge: 2013-07-14 | Disposition: A | Discharge status: Home / Self Care | Payer: BC Managed Care – PPO | Source: Ambulatory Visit | Attending: Radiation Oncology | Admitting: Radiation Oncology

## 2013-07-14 ENCOUNTER — Encounter: Payer: Self-pay | Admitting: Radiation Oncology

## 2013-07-14 VITALS — BP 118/58 | HR 64 | Temp 97.5°F | Ht 69.0 in | Wt 131.5 lb

## 2013-07-14 DIAGNOSIS — C7952 Secondary malignant neoplasm of bone marrow: Principal | ICD-10-CM

## 2013-07-14 DIAGNOSIS — C7951 Secondary malignant neoplasm of bone: Secondary | ICD-10-CM

## 2013-07-14 NOTE — Progress Notes (Signed)
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  He is much more comfortable at this time. He does have infusional  pain medication in place through hospice. Patient is having some mild problems with swallowing but no pain with swallowing at this time. His pain in the left shoulder area is better. He does have some discomfort in the labdominal region presumably related to his liver metastasis.                           ALLERGIES:  has No Known Allergies.  Meds: Current Outpatient Prescriptions  Medication Sig Dispense Refill  . buPROPion (BUDEPRION XL) 300 MG 24 hr tablet Take 300 mg by mouth daily.      Marland Kitchen dexamethasone (DECADRON) 4 MG tablet Take 4 mg by mouth 2 (two) times daily.      Marland Kitchen docusate sodium (COLACE) 100 MG capsule Take 100 mg by mouth 2 (two) times daily.      Marland Kitchen gabapentin (NEURONTIN) 300 MG capsule Take 300 mg by mouth 3 (three) times daily.       . haloperidol (HALDOL) 0.5 MG tablet Take 0.5 mg by mouth every 4 (four) hours as needed for agitation.      Marland Kitchen ibuprofen (ADVIL,MOTRIN) 200 MG tablet Take 200 mg by mouth daily as needed for moderate pain.       . magnesium oxide (MAG-OX) 400 MG tablet Take 800 mg by mouth daily.       . methadone (DOLOPHINE) 10 MG tablet Take 20 mg by mouth every 8 (eight) hours.      Marland Kitchen omeprazole (PRILOSEC) 40 MG capsule Take 40 mg by mouth daily.      . sennosides-docusate sodium (SENOKOT-S) 8.6-50 MG tablet Take 2 tablets by mouth 2 (two) times daily.      . tacrolimus (PROGRAF) 1 MG capsule Take 2 mg by mouth 2 (two) times daily.       Marland Kitchen LORazepam (ATIVAN) 1 MG tablet Take 1 mg by mouth every 2 (two)  hours as needed for anxiety.      Marland Kitchen LORazepam (ATIVAN) 1 MG tablet Take 1 mg by mouth at bedtime.       No current facility-administered medications for this encounter.    Physical Findings: The patient is in no acute distress. Patient is alert and oriented.  height is 5\' 9"  (1.753 m) and weight is 131 lb 8 oz (59.648 kg). His temperature is 97.5 F (36.4 C). His blood pressure is 118/58 and his pulse is 64. His oxygen saturation is 96%. .  The oral cavity is moist without secondary infection area the skin of the lower neck and upper chest well-healed. Patient is much more alert today and responds appropriately to questions.  Lab Findings: No results found for this basename: WBC, HGB, HCT, MCV, PLT      Radiographic Findings: No results found.  Impression:  The patient is recovering from the effects of radiation.  He appears to have had benefit from his palliative radiation therapy directed at the lower cervical and upper thoracic spine area.  Plan:  When necessary followup in radiation oncology. Hospice is coming out of the house on regular basis at this time.  ____________________________________ Blair Promise, MD

## 2013-07-14 NOTE — Progress Notes (Signed)
Colin Burke here for follow up after treatment to his lower neck and left shoulder.  He reports pain in his right side only at a 2/10.  He is taking decadron 4 mg bid.  His oral mucosa is intact.   He reports his swallowing is better.  He reports a good appetitie.  His weight is up 2 lbs since 06/07/13.  He reports fatigue in the afternoons.  He reports his skin on his neck and shoulder is intact.

## 2013-08-08 ENCOUNTER — Encounter (HOSPITAL_COMMUNITY): Payer: Self-pay | Admitting: Emergency Medicine

## 2013-08-08 ENCOUNTER — Inpatient Hospital Stay (HOSPITAL_COMMUNITY)
Admission: EM | Admit: 2013-08-08 | Discharge: 2013-08-10 | DRG: 543 | Disposition: A | Discharge status: Hospice | Payer: BC Managed Care – PPO | Attending: Internal Medicine | Admitting: Internal Medicine

## 2013-08-08 ENCOUNTER — Emergency Department (HOSPITAL_COMMUNITY): Payer: BC Managed Care – PPO

## 2013-08-08 DIAGNOSIS — I959 Hypotension, unspecified: Secondary | ICD-10-CM | POA: Diagnosis present

## 2013-08-08 DIAGNOSIS — C7952 Secondary malignant neoplasm of bone marrow: Principal | ICD-10-CM

## 2013-08-08 DIAGNOSIS — K1379 Other lesions of oral mucosa: Secondary | ICD-10-CM

## 2013-08-08 DIAGNOSIS — Z87891 Personal history of nicotine dependence: Secondary | ICD-10-CM

## 2013-08-08 DIAGNOSIS — Z66 Do not resuscitate: Secondary | ICD-10-CM | POA: Diagnosis present

## 2013-08-08 DIAGNOSIS — D62 Acute posthemorrhagic anemia: Secondary | ICD-10-CM

## 2013-08-08 DIAGNOSIS — B1920 Unspecified viral hepatitis C without hepatic coma: Secondary | ICD-10-CM

## 2013-08-08 DIAGNOSIS — C799 Secondary malignant neoplasm of unspecified site: Secondary | ICD-10-CM

## 2013-08-08 DIAGNOSIS — Z79899 Other long term (current) drug therapy: Secondary | ICD-10-CM

## 2013-08-08 DIAGNOSIS — C228 Malignant neoplasm of liver, primary, unspecified as to type: Secondary | ICD-10-CM | POA: Diagnosis present

## 2013-08-08 DIAGNOSIS — C7951 Secondary malignant neoplasm of bone: Principal | ICD-10-CM

## 2013-08-08 DIAGNOSIS — C220 Liver cell carcinoma: Secondary | ICD-10-CM | POA: Diagnosis present

## 2013-08-08 DIAGNOSIS — Z944 Liver transplant status: Secondary | ICD-10-CM

## 2013-08-08 DIAGNOSIS — Z515 Encounter for palliative care: Secondary | ICD-10-CM

## 2013-08-08 DIAGNOSIS — IMO0002 Reserved for concepts with insufficient information to code with codable children: Secondary | ICD-10-CM

## 2013-08-08 DIAGNOSIS — Z681 Body mass index (BMI) 19 or less, adult: Secondary | ICD-10-CM

## 2013-08-08 HISTORY — DX: Unspecified viral hepatitis C without hepatic coma: B19.20

## 2013-08-08 LAB — I-STAT CHEM 8, ED
BUN: 34 mg/dL — ABNORMAL HIGH (ref 6–23)
CALCIUM ION: 1.12 mmol/L — AB (ref 1.13–1.30)
Chloride: 101 mEq/L (ref 96–112)
Creatinine, Ser: 1.4 mg/dL — ABNORMAL HIGH (ref 0.50–1.35)
GLUCOSE: 168 mg/dL — AB (ref 70–99)
HCT: 29 % — ABNORMAL LOW (ref 39.0–52.0)
HEMOGLOBIN: 9.9 g/dL — AB (ref 13.0–17.0)
Potassium: 4.7 mEq/L (ref 3.7–5.3)
Sodium: 142 mEq/L (ref 137–147)
TCO2: 29 mmol/L (ref 0–100)

## 2013-08-08 LAB — I-STAT CG4 LACTIC ACID, ED: Lactic Acid, Venous: 1.26 mmol/L (ref 0.5–2.2)

## 2013-08-08 MED ORDER — SODIUM CHLORIDE 0.9 % IV BOLUS (SEPSIS)
1000.0000 mL | Freq: Once | INTRAVENOUS | Status: AC
  Administered 2013-08-08: 1000 mL via INTRAVENOUS

## 2013-08-08 MED ORDER — SODIUM CHLORIDE 0.9 % IV SOLN
INTRAVENOUS | Status: DC | PRN
  Administered 2013-08-08: 1000 mL via INTRAVENOUS

## 2013-08-08 MED ORDER — SODIUM CHLORIDE 0.9 % IV BOLUS (SEPSIS): 1000.0000 mL | Freq: Once | INTRAVENOUS | Status: AC

## 2013-08-08 NOTE — ED Notes (Signed)
Pt in via EMS, pt in after dinner, states after eating he started spitting up blood, unknown if there is an injury in the mouth, pt is spitting up large clots, feels like it is coming from his stomach, last BP for EMS was 72/42, pt is noted to be pale- HR 90's during transport

## 2013-08-09 DIAGNOSIS — C220 Liver cell carcinoma: Secondary | ICD-10-CM | POA: Diagnosis present

## 2013-08-09 DIAGNOSIS — C801 Malignant (primary) neoplasm, unspecified: Secondary | ICD-10-CM

## 2013-08-09 DIAGNOSIS — C7951 Secondary malignant neoplasm of bone: Principal | ICD-10-CM

## 2013-08-09 DIAGNOSIS — K1379 Other lesions of oral mucosa: Secondary | ICD-10-CM | POA: Diagnosis present

## 2013-08-09 DIAGNOSIS — B1920 Unspecified viral hepatitis C without hepatic coma: Secondary | ICD-10-CM | POA: Diagnosis present

## 2013-08-09 DIAGNOSIS — IMO0002 Reserved for concepts with insufficient information to code with codable children: Secondary | ICD-10-CM | POA: Diagnosis present

## 2013-08-09 DIAGNOSIS — C799 Secondary malignant neoplasm of unspecified site: Secondary | ICD-10-CM | POA: Diagnosis present

## 2013-08-09 DIAGNOSIS — D62 Acute posthemorrhagic anemia: Secondary | ICD-10-CM | POA: Diagnosis present

## 2013-08-09 DIAGNOSIS — K1370 Unspecified lesions of oral mucosa: Secondary | ICD-10-CM

## 2013-08-09 DIAGNOSIS — C7952 Secondary malignant neoplasm of bone marrow: Principal | ICD-10-CM

## 2013-08-09 DIAGNOSIS — C228 Malignant neoplasm of liver, primary, unspecified as to type: Secondary | ICD-10-CM

## 2013-08-09 LAB — CBC WITH DIFFERENTIAL/PLATELET
Basophils Absolute: 0 10*3/uL (ref 0.0–0.1)
Basophils Relative: 0 % (ref 0–1)
EOS PCT: 1 % (ref 0–5)
Eosinophils Absolute: 0.1 10*3/uL (ref 0.0–0.7)
HCT: 29.2 % — ABNORMAL LOW (ref 39.0–52.0)
HEMOGLOBIN: 9.1 g/dL — AB (ref 13.0–17.0)
LYMPHS ABS: 0.5 10*3/uL — AB (ref 0.7–4.0)
LYMPHS PCT: 9 % — AB (ref 12–46)
MCH: 30 pg (ref 26.0–34.0)
MCHC: 31.2 g/dL (ref 30.0–36.0)
MCV: 96.4 fL (ref 78.0–100.0)
MONOS PCT: 6 % (ref 3–12)
Monocytes Absolute: 0.3 10*3/uL (ref 0.1–1.0)
Neutro Abs: 4.7 10*3/uL (ref 1.7–7.7)
Neutrophils Relative %: 84 % — ABNORMAL HIGH (ref 43–77)
PLATELETS: 140 10*3/uL — AB (ref 150–400)
RBC: 3.03 MIL/uL — AB (ref 4.22–5.81)
RDW: 18 % — ABNORMAL HIGH (ref 11.5–15.5)
WBC: 5.5 10*3/uL (ref 4.0–10.5)

## 2013-08-09 LAB — BASIC METABOLIC PANEL
BUN: 37 mg/dL — ABNORMAL HIGH (ref 6–23)
CO2: 28 meq/L (ref 19–32)
Calcium: 7.7 mg/dL — ABNORMAL LOW (ref 8.4–10.5)
Chloride: 106 mEq/L (ref 96–112)
Creatinine, Ser: 1.26 mg/dL (ref 0.50–1.35)
GFR calc Af Amer: 69 mL/min — ABNORMAL LOW (ref >90)
GFR calc non Af Amer: 59 mL/min — ABNORMAL LOW (ref >90)
GLUCOSE: 168 mg/dL — AB (ref 70–99)
POTASSIUM: 5 meq/L (ref 3.7–5.3)
SODIUM: 141 meq/L (ref 137–147)

## 2013-08-09 LAB — CBC
HCT: 29.4 % — ABNORMAL LOW (ref 39.0–52.0)
HEMOGLOBIN: 9.4 g/dL — AB (ref 13.0–17.0)
MCH: 29.5 pg (ref 26.0–34.0)
MCHC: 32 g/dL (ref 30.0–36.0)
MCV: 92.2 fL (ref 78.0–100.0)
PLATELETS: DECREASED 10*3/uL (ref 150–400)
RBC: 3.19 MIL/uL — AB (ref 4.22–5.81)
RDW: 17.8 % — ABNORMAL HIGH (ref 11.5–15.5)
WBC: 4.2 10*3/uL (ref 4.0–10.5)

## 2013-08-09 LAB — HEPATIC FUNCTION PANEL
ALT: 60 U/L — ABNORMAL HIGH (ref 0–53)
AST: 50 U/L — ABNORMAL HIGH (ref 0–37)
Albumin: 1.8 g/dL — ABNORMAL LOW (ref 3.5–5.2)
Alkaline Phosphatase: 398 U/L — ABNORMAL HIGH (ref 39–117)
BILIRUBIN TOTAL: 2.9 mg/dL — AB (ref 0.3–1.2)
Bilirubin, Direct: 2.1 mg/dL — ABNORMAL HIGH (ref 0.0–0.3)
Indirect Bilirubin: 0.8 mg/dL (ref 0.3–0.9)
Total Protein: 4.7 g/dL — ABNORMAL LOW (ref 6.0–8.3)

## 2013-08-09 LAB — PREPARE RBC (CROSSMATCH)

## 2013-08-09 LAB — PROTIME-INR
INR: 1.05 (ref 0.00–1.49)
PROTHROMBIN TIME: 13.5 s (ref 11.6–15.2)

## 2013-08-09 LAB — TROPONIN I: Troponin I: <0.3 ng/mL (ref <0.30)

## 2013-08-09 LAB — ABO/RH: ABO/RH(D): O POS

## 2013-08-09 LAB — APTT: aPTT: 28 seconds (ref 24–37)

## 2013-08-09 LAB — AMMONIA: Ammonia: 56 umol/L (ref 11–60)

## 2013-08-09 MED ORDER — SODIUM CHLORIDE 0.9 % IJ SOLN: 3.0000 mL | INTRAMUSCULAR | Status: DC | PRN

## 2013-08-09 MED ORDER — PANTOPRAZOLE SODIUM 40 MG PO TBEC
40.0000 mg | DELAYED_RELEASE_TABLET | Freq: Every day | ORAL | Status: DC
  Administered 2013-08-09 – 2013-08-10 (×2): 40 mg via ORAL
  Filled 2013-08-09 (×2): qty 1

## 2013-08-09 MED ORDER — SENNOSIDES-DOCUSATE SODIUM 8.6-50 MG PO TABS
2.0000 | ORAL_TABLET | Freq: Two times a day (BID) | ORAL | Status: DC
  Administered 2013-08-09 – 2013-08-10 (×3): 2 via ORAL
  Filled 2013-08-09 (×4): qty 2

## 2013-08-09 MED ORDER — SENNA-DOCUSATE SODIUM 8.6-50 MG PO TABS: 2.0000 | ORAL_TABLET | Freq: Two times a day (BID) | ORAL | Status: DC

## 2013-08-09 MED ORDER — BUPROPION HCL ER (XL) 300 MG PO TB24
300.0000 mg | ORAL_TABLET | Freq: Every day | ORAL | Status: DC
  Administered 2013-08-09 – 2013-08-10 (×2): 300 mg via ORAL
  Filled 2013-08-09 (×2): qty 1

## 2013-08-09 MED ORDER — SODIUM CHLORIDE 0.9 % IJ SOLN
3.0000 mL | Freq: Two times a day (BID) | INTRAMUSCULAR | Status: DC
  Administered 2013-08-09 – 2013-08-10 (×3): 3 mL via INTRAVENOUS

## 2013-08-09 MED ORDER — SODIUM CHLORIDE 0.9 % IV SOLN: INTRAVENOUS | Status: DC

## 2013-08-09 MED ORDER — DEXAMETHASONE 4 MG PO TABS
4.0000 mg | ORAL_TABLET | Freq: Two times a day (BID) | ORAL | Status: DC
  Administered 2013-08-09 – 2013-08-10 (×3): 4 mg via ORAL
  Filled 2013-08-09 (×4): qty 1

## 2013-08-09 MED ORDER — LORAZEPAM 1 MG PO TABS
1.0000 mg | ORAL_TABLET | Freq: Every day | ORAL | Status: DC
  Administered 2013-08-09: 1 mg via ORAL
  Filled 2013-08-09: qty 1

## 2013-08-09 MED ORDER — METHADONE HCL 10 MG PO TABS
20.0000 mg | ORAL_TABLET | Freq: Three times a day (TID) | ORAL | Status: DC
  Administered 2013-08-09 – 2013-08-10 (×3): 20 mg via ORAL
  Filled 2013-08-09 (×3): qty 2

## 2013-08-09 MED ORDER — SODIUM CHLORIDE 0.9 % IV SOLN: 250.0000 mL | INTRAVENOUS | Status: DC | PRN

## 2013-08-09 MED ORDER — ONDANSETRON HCL 4 MG PO TABS
4.0000 mg | ORAL_TABLET | Freq: Four times a day (QID) | ORAL | Status: DC | PRN
  Administered 2013-08-09: 4 mg via ORAL
  Filled 2013-08-09: qty 1

## 2013-08-09 MED ORDER — GABAPENTIN 300 MG PO CAPS
300.0000 mg | ORAL_CAPSULE | Freq: Three times a day (TID) | ORAL | Status: DC
  Administered 2013-08-09 – 2013-08-10 (×2): 300 mg via ORAL
  Filled 2013-08-09 (×5): qty 1

## 2013-08-09 MED ORDER — ONDANSETRON HCL 4 MG/2ML IJ SOLN: 4.0000 mg | Freq: Four times a day (QID) | INTRAMUSCULAR | Status: DC | PRN

## 2013-08-09 NOTE — ED Provider Notes (Signed)
CSN: [id number]     Arrival date & time 08/08/13  2240 History   First MD Initiated Contact with Patient 08/08/13 2248     Chief Complaint  Patient presents with  . Spitting up blood    . GI Bleeding     (Consider location/radiation/quality/duration/timing/severity/associated sxs/prior Treatment) The history is provided by the patient.   63 year old male. With known metastatic hepatocellular carcinoma. Patient is under palliative care he is a DO NOT RESUSCITATE. Patient receiving no more therapy. Patients on infusion of pain medicine for the chronic pain. Patient known to have a tumor lesion to his left jaw cheek area. Tonight during dinner thinks he may be bit down on that area and started bleeding. Bleeding extensively this occurred around 8:00 at night. EMS was eventually called blood pressure upon arrival was 72/42. Patient's heart rate was in the 90s. Patient was pale. Patient arrived here hypotensive with extensive bleeding from the oral area. Patient denied any other symptoms. Patient currently not undergoing any therapy. Just palliative care.  Past Medical History  Diagnosis Date  . 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 History of radiation therapy 05/25/2013-06/08/2013    lower cervical and upper thoracic spine  . Hepatitis C    Past Surgical History  Procedure Laterality Date  . Liver transplant  2011  . Liver biopsy  2013   History reviewed. No pertinent family history. History  Substance Use Topics  . Smoking status: Former Smoker -- 1.50 packs/day for 40 years    Types: Cigarettes    Quit date: 03/31/2009  . Smokeless tobacco: Not on file  . Alcohol Use: No    Review of Systems  Constitutional: Negative for fever.  HENT: Negative for congestion and trouble swallowing.   Eyes: Negative for visual disturbance.  Respiratory: Negative for shortness of breath.   Cardiovascular: Negative for chest pain.  Gastrointestinal: Positive for abdominal pain. Negative  for nausea and vomiting.  Musculoskeletal: Positive for myalgias.  Skin: Positive for pallor. Negative for rash.  Neurological: Negative for headaches.  Hematological: Bruises/bleeds easily.  Psychiatric/Behavioral: Negative for confusion.      Allergies  Review of patient's allergies indicates no known allergies.  Home Medications   Prior to Admission medications   Medication Sig Start Date End Date Taking? Authorizing Provider  buPROPion (BUDEPRION XL) 300 MG 24 hr tablet Take 300 mg by mouth daily. 05/21/12  Yes Historical Provider, MD  dexamethasone (DECADRON) 4 MG tablet Take 4 mg by mouth 2 (two) times daily.   Yes Historical Provider, MD  docusate sodium (COLACE) 100 MG capsule Take 100 mg by mouth 2 (two) times daily.   Yes Historical Provider, MD  gabapentin (NEURONTIN) 300 MG capsule Take 300 mg by mouth 3 (three) times daily.  05/12/13 05/12/14 Yes Historical Provider, MD  LORazepam (ATIVAN) 1 MG tablet Take 1 mg by mouth at bedtime.    Yes Historical Provider, MD  magnesium oxide (MAG-OX) 400 MG tablet Take 800 mg by mouth daily.  06/11/12  Yes Historical Provider, MD  methadone (DOLOPHINE) 10 MG tablet Take 20 mg by mouth every 8 (eight) hours.   Yes Historical Provider, MD  omeprazole (PRILOSEC) 40 MG capsule Take 40 mg by mouth daily.   Yes Historical Provider, MD  sennosides-docusate sodium (SENOKOT-S) 8.6-50 MG tablet Take 2 tablets by mouth 2 (two) times daily.   Yes Historical Provider, MD  tacrolimus (PROGRAF) 1 MG capsule Take 2 mg by mouth 2 (two) times daily.  03/22/13  Yes Historical Provider, MD   BP 102/57  Pulse 73  Resp 12  SpO2 99% Physical Exam  Nursing note and vitals reviewed. Constitutional: He is oriented to person, place, and time. He appears well-developed and well-nourished. He appears distressed.  HENT:  Head: Normocephalic.  Patient with extensive bleeding from a lesion left lower jaw right behind the last molar. The opening area to the glucose  is about 1 x 2 cm. 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 This was packed off. No other bleeding.  Eyes: Conjunctivae and EOM are normal. Pupils are equal, round, and reactive to light.  Neck: Normal range of motion.  Cardiovascular: Normal rate and normal heart sounds.   Pulmonary/Chest: Effort normal and breath sounds normal. No respiratory distress.  Abdominal: Soft. Bowel sounds are normal.  Musculoskeletal: Normal range of motion. He exhibits no edema.  Lymphadenopathy:    He has no cervical adenopathy.  Neurological: He is alert and oriented to person, place, and time. No cranial nerve deficit.  Skin: There is pallor.    ED Course  Procedures (including critical care time) Labs Review Labs Reviewed  CBC WITH DIFFERENTIAL - Abnormal; Notable for the following:    RBC 3.03 (*)    Hemoglobin 9.1 (*)    HCT 29.2 (*)    RDW 18.0 (*)    Platelets 140 (*)    Neutrophils Relative % 84 (*)    Lymphocytes Relative 9 (*)    Lymphs Abs 0.5 (*)    All other components within normal limits  I-STAT CHEM 8, ED - Abnormal; Notable for the following:    BUN 34 (*)    Creatinine, Ser 1.40 (*)    Glucose, Bld 168 (*)    Calcium, Ion 1.12 (*)    Hemoglobin 9.9 (*)    HCT 29.0 (*)    All other components within normal limits  PROTIME-INR  APTT  BASIC METABOLIC PANEL  TROPONIN I  HEPATIC FUNCTION PANEL  AMMONIA  I-STAT CG4 LACTIC ACID, ED  TYPE AND SCREEN  PREPARE RBC (CROSSMATCH)   Results for orders placed during the hospital encounter of 08/08/13  CBC WITH DIFFERENTIAL      Result Value Ref Range   WBC 5.5  4.0 - 10.5 K/uL   RBC 3.03 (*) 4.22 - 5.81 MIL/uL   Hemoglobin 9.1 (*) 13.0 - 17.0 g/dL   HCT 29.2 (*) 39.0 - 52.0 %   MCV 96.4  78.0 - 100.0 fL   MCH 30.0  26.0 - 34.0 pg   MCHC 31.2  30.0 - 36.0 g/dL   RDW 18.0 (*) 11.5 - 15.5 %   Platelets 140 (*) 150 - 400 K/uL   Neutrophils Relative % 84 (*) 43 - 77 %   Neutro Abs 4.7  1.7 - 7.7 K/uL   Lymphocytes Relative 9 (*) 12 - 46 %    Lymphs Abs 0.5 (*) 0.7 - 4.0 K/uL   Monocytes Relative 6  3 - 12 %   Monocytes Absolute 0.3  0.1 - 1.0 K/uL   Eosinophils Relative 1  0 - 5 %   Eosinophils Absolute 0.1  0.0 - 0.7 K/uL   Basophils Relative 0  0 - 1 %   Basophils Absolute 0.0  0.0 - 0.1 K/uL  PROTIME-INR      Result Value Ref Range   Prothrombin Time 13.5  11.6 - 15.2 seconds   INR 1.05  0.00 - 1.49  APTT      Result Value  Ref Range   aPTT 28  24 - 37 seconds  I-STAT CG4 LACTIC ACID, ED      Result Value Ref Range   Lactic Acid, Venous 1.26  0.5 - 2.2 mmol/L  I-STAT CHEM 8, ED      Result Value Ref Range   Sodium 142  137 - 147 mEq/L   Potassium 4.7  3.7 - 5.3 mEq/L   Chloride 101  96 - 112 mEq/L   BUN 34 (*) 6 - 23 mg/dL   Creatinine, Ser 1.40 (*) 0.50 - 1.35 mg/dL   Glucose, Bld 168 (*) 70 - 99 mg/dL   Calcium, Ion 1.12 (*) 1.13 - 1.30 mmol/L   TCO2 29  0 - 100 mmol/L   Hemoglobin 9.9 (*) 13.0 - 17.0 g/dL   HCT 29.0 (*) 39.0 - 52.0 %  TYPE AND SCREEN      Result Value Ref Range   ABO/RH(D) O POS     Antibody Screen PENDING     Sample Expiration 08/11/2013    PREPARE RBC (CROSSMATCH)      Result Value Ref Range   Order Confirmation ORDER PROCESSED BY BLOOD BANK       Imaging Review Dg Chest Port 1 View  08/09/2013   CLINICAL DATA:  Shortness of breath.  GI bleeding.  EXAM: PORTABLE CHEST - 1 VIEW  COMPARISON:  None.  FINDINGS: Shallow inspiration with elevation of the right hemidiaphragm. Heart size and pulmonary vascularity are normal. Left PICC line tip overlies the cavoatrial junction. No focal airspace disease or consolidation in the lungs. No pneumothorax. Calcified and tortuous aorta.  IMPRESSION: No active disease.   Electronically Signed   By: Lucienne Capers M.D.   On: 08/09/2013 00:18     EKG Interpretation   Date/Time:  Tuesday Aug 09 2013 00:23:09 EDT Ventricular Rate:  71 PR Interval:  124 QRS Duration: 75 QT Interval:  401 QTC Calculation: 436 R Axis:   50 Text Interpretation:   Sinus rhythm Low voltage, precordial leads No  previous ECGs available Confirmed by Aldous Housel  MD, [place] Pulse [phone]) on  08/09/2013 12:27:48 AM      CRITICAL CARE Performed by: Mervin Kung Total critical care time: 30 Critical care time was exclusive of separately billable procedures and treating other patients. Critical care was necessary to treat or prevent imminent or life-threatening deterioration. Critical care was time spent personally by me on the following activities: development of treatment plan with patient and/or surrogate as well as nursing, discussions with consultants, evaluation of patient's response to treatment, examination of patient, obtaining history from patient or surrogate, ordering and performing treatments and interventions, ordering and review of laboratory studies, ordering and review of radiographic studies, pulse oximetry and re-evaluation of patient's condition.      MDM   Final diagnoses:  Secondary malignant neoplasm of bone and bone marrow  Oral bleeding    Patient is a DO NOT RESUSCITATE. Patient under hospice care. Patient believes he bit into the left side of his cheek lower around the last molar and started having bleeding that started around 8:00 this evening. Bleeding persisted. Reports lots of bleeding at home. Hypotensive for EMS blood pressure was 72/42. Patient arrived here hypotensive. Patient given 2 L of fluid blood pressure now up to 102. Patient typed and crossed for blood. That's units of blood ordered. Patient's hemoglobin currently at 9.  Patient was examined and noted to be bleeding from that area around the left lower molar skin area  measuring about 1 x 2 cm. 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 There is packed off gauze. Packing controlled the bleeding.   After packing off the bleeding site. Discussed with Dr. Melene Plan who examined the patient agreed that the like and he can be done as packing. Patient will require admission. Also would continue  with transfusion. [facility] consult. Patient is formally unassigned. Patient's PT and PTT and platelets are normal.    Mervin Kung, MD 08/09/13 [phone]

## 2013-08-09 NOTE — ED Notes (Signed)
Admission MD at bedside.  

## 2013-08-09 NOTE — ED Notes (Signed)
No s/s of transfusion reaction, pt respirations even and nonlabored, denies new pain, no distress noted

## 2013-08-09 NOTE — Progress Notes (Addendum)
Inpatient [facility] [address] RN Visit- M. Wynetta Emery, RN  Related admission to [facility] diagnosis of Liver cancer, mets to bone; pt is a DNR code status  9 am-Pt seen at bedside asleep, mouth open -L side face swollen with packing gauze visible;  spoke with Staff RN Joellen Jersey who indicated pt arrived on floor approximately 6:30 am and was exhausted after receiving 2 U PRBC through the night. 11:15 am returned to se pt staff RN Curt Bears in room to give meds- pt able to swallow small pill with sip of water w/o difficulty -packing in L cheek still in place, awaiting ENT consult - pt has L UE PICC in place with continuous infusion of Dilaudid 1 mg/hr with bolus dose of 0.25mg  q 15 minutes prn - per CADD pump which is confirmed to be locked  -per discussion with [facility] homecare RN, pump was cleared yesterday approximately 12 noon - per pump setting review pt has received 10 out of 12 attempted bolus doses in the last 24 hours along with the continuous rate infusion this was as of 11:45 am Pt denied any pain or discomfort on this visit, was alert/oriented; stated no further bleeding from jaw/cheek noted  -pt stated plan was to discharge back home with [facility] following when he was medically ready he stated his wife would transport. No family at bedside at this time. Writer discussed CADD pump infusion with bedside RN and charge nurse - they will follow up with attending MD to see if there is a need to change out this system - Writer has also placed a call to [facility] representative Pam to ask about CADD pump - Probation officer will ask staff to notify when decision is made on infusion need and writer can return to disconnect from CADD pump if needed.   [facility] will continue to follow  Pt home medication list, transfer summary and OOF DNR form on shadow chart; Please call [facility] @ [phone]-  with any hospice needs.   Addendum: this is a related admission though non-covered due to private  insurance  Thank you.  Danton Sewer, RN  [facility]  Hospice Liaison  [phone])

## 2013-08-09 NOTE — Progress Notes (Signed)
Patient admitted after midnight. Chart reviewed. Patient examined. Discussed with Dr. Benjamine Mola. No further bleeding, so packing removed. Diet ordered and patient was to be discharged, but began having oral bleeding again after eating a sandwich. Per Dr. Benjamine Mola, no other options other than holding pressure or packing when necessary bleeding. Will change diet to pure. Monitor overnight for hypotension or bleeding that is not controlled.  May continue Dilaudid pump from home via PICC line. Home medications resumed.  Doree Barthel, M.D. [facility]

## 2013-08-09 NOTE — H&P (Signed)
PCP:   [name], FNP   Chief Complaint:  Mouth bleeding  HPI: 63 yo male stage 4 hepatocellular cancer with a lesion in his mouth/mucosa was eating tonight and it started bleeding overtly.  ems called, pt was hypotensive at first which has resolved with 2 liter of ivf.  ent dr Melene Plan and edp were able to get the bleeding to stop after packing.  Is getting blood transfusion now.  He is on hospice and dnr.  Not getting any further treatment for his cancer.  He denies any pain.  He wants to take the packing out, have advised to wait until the am.  Denies its bleeding anymore.  Not on any aspirin products at home.  Review of Systems:  Positive and negative as per HPI otherwise all other systems are negative  Past Medical History: Past Medical History  Diagnosis Date  . 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 History of radiation therapy 05/25/2013-06/08/2013    lower cervical and upper thoracic spine  . Hepatitis C    Past Surgical History  Procedure Laterality Date  . Liver transplant  2011  . Liver biopsy  2013    Medications: Prior to Admission medications   Medication Sig Start Date End Date Taking? Authorizing Provider  buPROPion (BUDEPRION XL) 300 MG 24 hr tablet Take 300 mg by mouth daily. 05/21/12  Yes Historical Provider, MD  dexamethasone (DECADRON) 4 MG tablet Take 4 mg by mouth 2 (two) times daily.   Yes Historical Provider, MD  docusate sodium (COLACE) 100 MG capsule Take 100 mg by mouth 2 (two) times daily.   Yes Historical Provider, MD  gabapentin (NEURONTIN) 300 MG capsule Take 300 mg by mouth 3 (three) times daily.  05/12/13 05/12/14 Yes Historical Provider, MD  LORazepam (ATIVAN) 1 MG tablet Take 1 mg by mouth at bedtime.    Yes Historical Provider, MD  magnesium oxide (MAG-OX) 400 MG tablet Take 800 mg by mouth daily.  06/11/12  Yes Historical Provider, MD  methadone (DOLOPHINE) 10 MG tablet Take 20 mg by mouth every 8 (eight) hours.   Yes Historical Provider,  MD  omeprazole (PRILOSEC) 40 MG capsule Take 40 mg by mouth daily.   Yes Historical Provider, MD  sennosides-docusate sodium (SENOKOT-S) 8.6-50 MG tablet Take 2 tablets by mouth 2 (two) times daily.   Yes Historical Provider, MD  tacrolimus (PROGRAF) 1 MG capsule Take 2 mg by mouth 2 (two) times daily.  03/22/13  Yes Historical Provider, MD    Allergies:  No Known Allergies  Social History:  reports that he quit smoking about 4 years ago. His smoking use included Cigarettes. He has a 60 pack-year smoking history. He does not have any smokeless tobacco history on file. He reports that he does not drink alcohol or use illicit drugs.  Family History: History reviewed. No pertinent family history.  Physical Exam: Filed Vitals:   08/08/13 2330 08/08/13 2345 08/09/13 0000 08/09/13 0015  BP: 93/68 97/59 98/52  102/57  Pulse: 83 70 70 73  TempSrc:      Resp: 16 16 18 12   SpO2: 100% 99% 100% 99%   General appearance: alert, cooperative and no distress Head: Normocephalic, without obvious abnormality, atraumatic Eyes: negative Nose: Nares normal. Septum midline. Mucosa normal. No drainage or sinus tenderness. Neck: no JVD and supple, symmetrical, trachea midline Lungs: clear to auscultation bilaterally Heart: regular rate and rhythm, S1, S2 normal, no murmur, click, rub or gallop Abdomen: soft, non-tender; bowel sounds  normal; no masses,  no organomegaly Extremities: extremities normal, atraumatic, no cyanosis or edema Pulses: 2+ and symmetric Skin: Skin color, texture, turgor normal. No rashes or lesions Neurologic: Grossly normal  Labs on Admission:   Recent Labs  08/08/13 2337 08/08/13 2353  NA 141 142  K 5.0 4.7  CL 106 101  CO2 28  --   GLUCOSE 168* 168*  BUN 37* 34*  CREATININE 1.26 1.40*  CALCIUM 7.7*  --     Recent Labs  08/08/13 2337  AST 50*  ALT 60*  ALKPHOS 398*  BILITOT 2.9*  PROT 4.7*  ALBUMIN 1.8*    Recent Labs  08/08/13 2337 08/08/13 2353  WBC  5.5  --   NEUTROABS 4.7  --   HGB 9.1* 9.9*  HCT 29.2* 29.0*  MCV 96.4  --   PLT 140*  --    Radiological Exams on Admission: Dg Chest Port 1 View  08/09/2013   CLINICAL DATA:  Shortness of breath.  GI bleeding.  EXAM: PORTABLE CHEST - 1 VIEW  COMPARISON:  None.  FINDINGS: Shallow inspiration with elevation of the right hemidiaphragm. Heart size and pulmonary vascularity are normal. Left PICC line tip overlies the cavoatrial junction. No focal airspace disease or consolidation in the lungs. No pneumothorax. Calcified and tortuous aorta.  IMPRESSION: No active disease.   Electronically Signed   By: Lucienne Capers M.D.   On: 08/09/2013 00:18    Assessment/Plan  63 yo male with overt bleeding from metastatic bony lesion in mouth which seems to have resolved  Principal Problem:   Bleeding from mouth-  Transfusing couple of units of prbcs due to the significance of the hypotension (sbp 70).  Bleeding seems to have stopped.  ENT following.  Place in stepdown overnight.  Repeat h/h in am after transfusing or earlier if he bleeds again.  coags normal.  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

## 2013-08-09 NOTE — Progress Notes (Signed)
Pt resting comfortably.  Wife and daughter in law with pt.  Pt kept eyes closed and did not interact with LCSW.  Wife says that physician had said that pt could go home today but then when pt tried to eat lunch his mouth started bleeding again so he is not going to be discharged.  Encouraged wife to go home to get some rest.  Ollen Gross, [place] ext. [place]

## 2013-08-10 LAB — HEMOGLOBIN AND HEMATOCRIT, BLOOD
HCT: 31.5 % — ABNORMAL LOW (ref 39.0–52.0)
HEMOGLOBIN: 10.2 g/dL — AB (ref 13.0–17.0)

## 2013-08-10 NOTE — Discharge Summary (Signed)
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 ems called, pt was hypotensive at first which has resolved with 2 liter of ivf. ent dr Melene Plan and edp were able to get the bleeding to stop after packing. Is getting blood transfusion now. He is on hospice and dnr. Not getting any further treatment for his cancer. He denies any pain. He wants to take the packing out, have advised to wait until the am. Denies its bleeding anymore. Not on any aspirin products at home.   Per Dr. Benjamine Mola, no other options other than holding pressure or packing when necessary for bleeding   Procedures:    Consultations:  ENT- in ER  Discharge Exam: Filed Vitals:   08/10/13 0600  BP: 103/57  Pulse:   Temp:   Resp:     General: A+Ox3, NAD Cardiovascular: rrr Respiratory: clear anterior  Discharge Instructions You were cared for by a hospitalist during your hospital stay. If you have any questions about your discharge medications or the care you received while you were in the  hospital after you are discharged, you can call the unit and asked to speak with the hospitalist on call if the hospitalist that took care of you is not available. Once you are discharged, your primary care physician will handle any further medical issues. Please note that NO REFILLS for any discharge medications will be authorized once you are discharged, as it is imperative that you return to your primary care physician (or establish a relationship with a primary care physician if you do not have one) for your aftercare needs so that they can reassess your need for medications and monitor your lab values.  Discharge Orders   Future Orders Complete By Expires   Diet - low sodium heart healthy  As directed    Discharge instructions  As directed    Increase activity slowly  As directed        Medication List         BUDEPRION XL 300 MG 24 hr tablet  Generic drug:  buPROPion  Take 300 mg by mouth daily.     dexamethasone 4 MG tablet  Commonly known as:  DECADRON  Take 4 mg by mouth 2 (two) times daily.     docusate sodium 100 MG capsule  Commonly known as:  COLACE  Take 100 mg by mouth 2 (two) times daily.     LORazepam 1 MG tablet  Commonly known as:  ATIVAN  Take 1 mg by mouth at bedtime.     magnesium oxide 400 MG tablet  Commonly known as:  MAG-OX  Take 800 mg by mouth daily.     methadone 10 MG tablet  Commonly known as:  DOLOPHINE  Take 20 mg by mouth every 8 (eight) hours.     NEURONTIN 300 MG capsule  Generic drug:  gabapentin  Take 300 mg by mouth 3 (three) times daily.     omeprazole 40 MG capsule  Commonly known as:  PRILOSEC  Take 40 mg by mouth daily.     PROGRAF 1 MG capsule  Generic drug:  tacrolimus  Take 2 mg by mouth 2 (two) times daily.     sennosides-docusate sodium 8.6-50 MG tablet  Commonly known as:  SENOKOT-S  Take 2 tablets by mouth 2 (two) times daily.       No Known Allergies    The results of significant diagnostics from this  hospitalization (including imaging, microbiology, ancillary and laboratory) are listed below for reference.    Significant Diagnostic Studies: Dg Chest Port 1 View  08/09/2013   CLINICAL DATA:  Shortness of breath.  GI bleeding.  EXAM: PORTABLE CHEST - 1 VIEW  COMPARISON:  None.  FINDINGS: Shallow inspiration with elevation of the right hemidiaphragm. Heart size and pulmonary vascularity are normal. Left PICC line tip overlies the cavoatrial junction. No focal airspace disease or consolidation in the lungs. No pneumothorax. Calcified and tortuous aorta.  IMPRESSION: No active disease.   Electronically Signed   By: Lucienne Capers M.D.   On: 08/09/2013 00:18    Microbiology: No results found for this or any previous visit (from the past 240 hour(s)).   Labs: Basic Metabolic Panel:  Recent Labs Lab 08/08/13 2337 08/08/13 2353  NA 141 142  K 5.0 4.7  CL 106 101  CO2 28  --   GLUCOSE 168* 168*  BUN 37* 34*  CREATININE 1.26 1.40*  CALCIUM 7.7*  --    Liver Function Tests:  Recent Labs Lab 08/08/13 2337  AST 50*  ALT 60*  ALKPHOS 398*  BILITOT 2.9*  PROT 4.7*  ALBUMIN 1.8*   No results found for this basename: LIPASE, AMYLASE,  in the last 168 hours  Recent Labs Lab 08/09/13 0016  AMMONIA 56   CBC:  Recent Labs Lab 08/08/13 2337 08/08/13 2353 08/09/13 0812 08/10/13 0252  WBC 5.5  --  4.2  --   NEUTROABS 4.7  --   --   --   HGB 9.1* 9.9* 9.4* 10.2*  HCT 29.2* 29.0* 29.4* 31.5*  MCV 96.4  --  92.2  --   PLT 140*  --  PLATELET CLUMPS NOTED ON SMEAR, COUNT APPEARS DECREASED  --    Cardiac Enzymes:  Recent Labs Lab 08/08/13 2337  TROPONINI <0.30   BNP: BNP (last 3 results) No results found for this basename: PROBNP,  in the last 8760 hours CBG: No results found for this basename: GLUCAP,  in the last 168 hours     Signed:  Geradine Girt  [facility] 08/10/2013, 11:17 AM

## 2013-08-10 NOTE — Progress Notes (Signed)
Pt and daughter and wife given d/c instructions; PIV d/c at this time; all verbalized understanding of d/c instructions; pt to d/c home with family; will cont. To monitor.

## 2013-08-10 NOTE — Progress Notes (Signed)
Inpatient [facility] Rm [address] [address] [place] RN Visit- M. Wynetta Emery, RN   Related/non-covered due to private insurance; [facility] diagnosis of Liver cancer, mets to bone; pt is a DNR code status  Pt seen at bedside; alert oriented to person, placer, time; stated he had a 'pretty good' night, denied pain/discomfort -stated he just feels really tired; denies dizziness when ambulating Hgb =10. 2 today; has been up to the bathroom without any difficulty. -gauze packing on L jaw area remains in place since yesterday; pt reports no more bleeding; has been able to take mostly liquids; did taste oatmeal but says he was too scared to eat too much. Pt stated he spoke with Dr Eliseo Squires this morning and is hopeful for return home today Wife at bedside, stated she is hopeful packing to be able to come out and pt tolerate liquids without additional bleeding prior to going home- discussed options for soft/liquid consistency of food once home; being sure gauze packing and ice pack available should bleeding re-start wife voiced understanding; wife aware attending was in early this am and stated she would like to speak with Dr Eliseo Squires if she is available this morning- Dr Eliseo Squires aware and will f/u;  -L UE PICC in place with continuous infusion of Dilaudid 1 mg/hr with bolus dose of 0.25mg  q 15 minutes prn per home CADD pump set up -pump settings reviewed - currently pt has 167.2 ml residual volume concentration of 1mg /ml; infusing at 1mg /hr with 0.25mg  bolus doses available q 15 minutes; - since 08/08/13 at 11:43 am (approximately 48 hours) pt received 12/14 bolus attempts and a total of 49.05 mg Dilaudid  -CADD pump was cleared by this writer this morning at 10:39 am and placed again in locked position confirming continuous basal rate of Dilaudid 1mg /hr with bolus doses of 0.25mg  q 15 minutes prn for a total of 4 doses /hour  Wife stated she will transport pt home by personal vehicle should discharge be  confirmed. She is aware [facility] will follow up within 24 hours of discharge. Pt home medication list, transfer summary and OOF DNR form on shadow chart. *Please send GOLD DNR form in shadow chart home with pt  Please call [facility] @ [phone] with any hospice needs.  Danton Sewer, [facility] Liaison [phone]

## 2013-08-11 LAB — TYPE AND SCREEN
ABO/RH(D): O POS
ANTIBODY SCREEN: NEGATIVE
UNIT DIVISION: 0
UNIT DIVISION: 0
Unit division: 0
Unit division: 0

## 2013-09-28 DEATH — deceased

## 2015-07-10 IMAGING — CR DG CHEST 1V PORT
1 series · 1 of 1 positions shown · non-contrast
Comparison: None.

CLINICAL DATA: Shortness of breath.  GI bleeding.

EXAM:
PORTABLE CHEST - 1 VIEW

[AP]
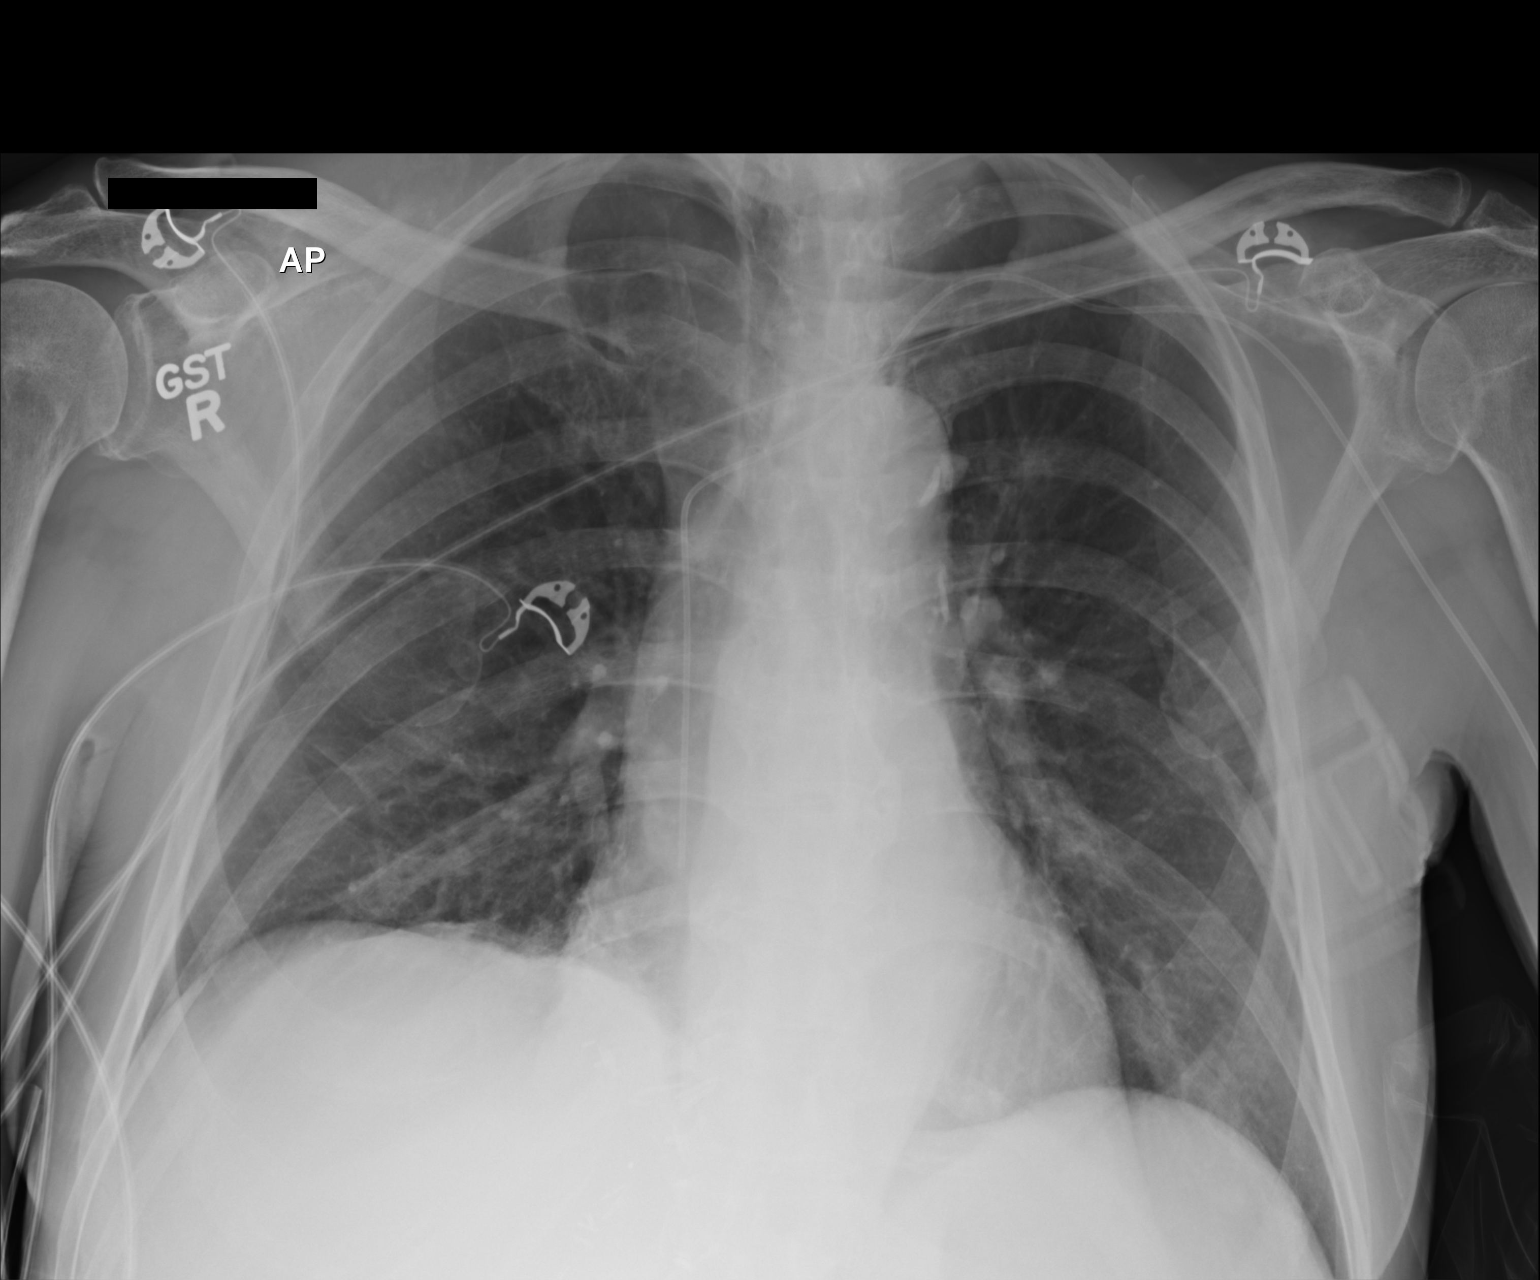

[1 of 1 positions shown; findings below may reference images not displayed]

FINDINGS: Shallow inspiration with elevation of the right hemidiaphragm. Heart
size and pulmonary vascularity are normal. Left PICC line tip
overlies the cavoatrial junction. No focal airspace disease or
consolidation in the lungs. No pneumothorax. Calcified and tortuous
aorta.
IMPRESSION: No active disease.
